# Patient Record
Sex: Male | Born: 1980 | Hispanic: No | Marital: Married | Smoking: Never smoker
Health system: Southern US, Community
[De-identification: ages and names within clinical notes are randomized; demographics above are authoritative.]

## PROBLEM LIST (undated history)

## (undated) DIAGNOSIS — K649 Unspecified hemorrhoids: Secondary | ICD-10-CM

---

## 2015-07-21 ENCOUNTER — Ambulatory Visit (HOSPITAL_COMMUNITY)
Admission: EM | Admit: 2015-07-21 | Discharge: 2015-07-22 | Disposition: A | Discharge status: Home / Self Care | Payer: BLUE CROSS/BLUE SHIELD | Attending: General Surgery | Admitting: General Surgery

## 2015-07-21 ENCOUNTER — Ambulatory Visit (HOSPITAL_COMMUNITY): Payer: BLUE CROSS/BLUE SHIELD | Admitting: Anesthesiology

## 2015-07-21 ENCOUNTER — Encounter (HOSPITAL_COMMUNITY): Admission: EM | Disposition: A | Discharge status: Home / Self Care | Payer: Self-pay | Source: Home / Self Care

## 2015-07-21 ENCOUNTER — Encounter (HOSPITAL_COMMUNITY): Payer: Self-pay

## 2015-07-21 ENCOUNTER — Emergency Department (HOSPITAL_COMMUNITY): Payer: BLUE CROSS/BLUE SHIELD

## 2015-07-21 DIAGNOSIS — K3580 Unspecified acute appendicitis: Secondary | ICD-10-CM | POA: Diagnosis not present

## 2015-07-21 HISTORY — PX: LAPAROSCOPIC APPENDECTOMY: SHX408

## 2015-07-21 LAB — COMPREHENSIVE METABOLIC PANEL
ALBUMIN: 4.2 g/dL (ref 3.5–5.0)
ALK PHOS: 60 U/L (ref 38–126)
ALT: 21 U/L (ref 17–63)
AST: 27 U/L (ref 15–41)
Anion gap: 10 (ref 5–15)
BILIRUBIN TOTAL: 2 mg/dL — AB (ref 0.3–1.2)
BUN: 15 mg/dL (ref 6–20)
CALCIUM: 9.5 mg/dL (ref 8.9–10.3)
CO2: 25 mmol/L (ref 22–32)
CREATININE: 1.07 mg/dL (ref 0.61–1.24)
Chloride: 100 mmol/L — ABNORMAL LOW (ref 101–111)
GFR calc Af Amer: >60 mL/min (ref >60)
GLUCOSE: 149 mg/dL — AB (ref 65–99)
POTASSIUM: 4 mmol/L (ref 3.5–5.1)
Sodium: 135 mmol/L (ref 135–145)
TOTAL PROTEIN: 6.9 g/dL (ref 6.5–8.1)

## 2015-07-21 LAB — CBC WITH DIFFERENTIAL/PLATELET
BASOS ABS: 0 10*3/uL (ref 0.0–0.1)
Basophils Relative: 0 %
EOS ABS: 0 10*3/uL (ref 0.0–0.7)
Eosinophils Relative: 0 %
HEMATOCRIT: 40.5 % (ref 39.0–52.0)
HEMOGLOBIN: 13.5 g/dL (ref 13.0–17.0)
LYMPHS ABS: 0.7 10*3/uL (ref 0.7–4.0)
LYMPHS PCT: 4 %
MCH: 26.3 pg (ref 26.0–34.0)
MCHC: 33.3 g/dL (ref 30.0–36.0)
MCV: 78.9 fL (ref 78.0–100.0)
Monocytes Absolute: 1.8 10*3/uL — ABNORMAL HIGH (ref 0.1–1.0)
Monocytes Relative: 11 %
NEUTROS PCT: 85 %
Neutro Abs: 13.6 10*3/uL — ABNORMAL HIGH (ref 1.7–7.7)
Platelets: 171 10*3/uL (ref 150–400)
RBC: 5.13 MIL/uL (ref 4.22–5.81)
RDW: 12.4 % (ref 11.5–15.5)
WBC: 16 10*3/uL — AB (ref 4.0–10.5)

## 2015-07-21 SURGERY — APPENDECTOMY, LAPAROSCOPIC
Anesthesia: General | Site: Abdomen

## 2015-07-21 MED ORDER — 0.9 % SODIUM CHLORIDE (POUR BTL) OPTIME
TOPICAL | Status: DC | PRN
  Administered 2015-07-21: 1000 mL

## 2015-07-21 MED ORDER — ONDANSETRON HCL 4 MG/2ML IJ SOLN: 4.0000 mg | Freq: Four times a day (QID) | INTRAMUSCULAR | Status: DC | PRN

## 2015-07-21 MED ORDER — PANTOPRAZOLE SODIUM 40 MG IV SOLR
40.0000 mg | Freq: Every day | INTRAVENOUS | Status: DC
  Administered 2015-07-21: 40 mg via INTRAVENOUS
  Filled 2015-07-21: qty 40

## 2015-07-21 MED ORDER — ACETAMINOPHEN 325 MG PO TABS: 650.0000 mg | ORAL_TABLET | Freq: Four times a day (QID) | ORAL | Status: DC | PRN

## 2015-07-21 MED ORDER — BUPIVACAINE-EPINEPHRINE (PF) 0.25% -1:200000 IJ SOLN
INTRAMUSCULAR | Status: AC
  Filled 2015-07-21: qty 30

## 2015-07-21 MED ORDER — METRONIDAZOLE IN NACL 5-0.79 MG/ML-% IV SOLN
500.0000 mg | Freq: Once | INTRAVENOUS | Status: AC
  Administered 2015-07-21: 500 mg via INTRAVENOUS
  Filled 2015-07-21: qty 100

## 2015-07-21 MED ORDER — BUPIVACAINE-EPINEPHRINE 0.25% -1:200000 IJ SOLN
INTRAMUSCULAR | Status: DC | PRN
  Administered 2015-07-21: 10 mL

## 2015-07-21 MED ORDER — IOHEXOL 300 MG/ML  SOLN
100.0000 mL | Freq: Once | INTRAMUSCULAR | Status: AC | PRN
  Administered 2015-07-21: 75 mL via INTRAVENOUS

## 2015-07-21 MED ORDER — LIDOCAINE HCL (CARDIAC) 20 MG/ML IV SOLN
INTRAVENOUS | Status: AC
  Filled 2015-07-21: qty 5

## 2015-07-21 MED ORDER — DEXTROSE 5 % IV SOLN
2.0000 g | Freq: Once | INTRAVENOUS | Status: AC
  Administered 2015-07-21: 2 g via INTRAVENOUS
  Filled 2015-07-21: qty 2

## 2015-07-21 MED ORDER — PROMETHAZINE HCL 25 MG/ML IJ SOLN: 6.2500 mg | INTRAMUSCULAR | Status: DC | PRN

## 2015-07-21 MED ORDER — LACTATED RINGERS IV SOLN
INTRAVENOUS | Status: DC
  Administered 2015-07-21: 17:00:00 via INTRAVENOUS

## 2015-07-21 MED ORDER — FENTANYL CITRATE (PF) 250 MCG/5ML IJ SOLN
INTRAMUSCULAR | Status: AC
  Filled 2015-07-21: qty 5

## 2015-07-21 MED ORDER — SUGAMMADEX SODIUM 500 MG/5ML IV SOLN
INTRAVENOUS | Status: DC | PRN
  Administered 2015-07-21: 118 mg via INTRAVENOUS

## 2015-07-21 MED ORDER — ACETAMINOPHEN 650 MG RE SUPP: 650.0000 mg | Freq: Four times a day (QID) | RECTAL | Status: DC | PRN

## 2015-07-21 MED ORDER — OXYCODONE-ACETAMINOPHEN 5-325 MG PO TABS: 1.0000 | ORAL_TABLET | ORAL | Status: DC | PRN

## 2015-07-21 MED ORDER — MIDAZOLAM HCL 2 MG/2ML IJ SOLN
INTRAMUSCULAR | Status: AC
  Filled 2015-07-21: qty 4

## 2015-07-21 MED ORDER — ONDANSETRON HCL 4 MG/2ML IJ SOLN: 4.0000 mg | INTRAMUSCULAR | Status: DC | PRN

## 2015-07-21 MED ORDER — DEXAMETHASONE SODIUM PHOSPHATE 4 MG/ML IJ SOLN
INTRAMUSCULAR | Status: AC
  Filled 2015-07-21: qty 2

## 2015-07-21 MED ORDER — ONDANSETRON 4 MG PO TBDP: 4.0000 mg | ORAL_TABLET | Freq: Four times a day (QID) | ORAL | Status: DC | PRN

## 2015-07-21 MED ORDER — MORPHINE SULFATE (PF) 2 MG/ML IV SOLN: 1.0000 mg | INTRAVENOUS | Status: DC | PRN

## 2015-07-21 MED ORDER — HYDROMORPHONE HCL 1 MG/ML IJ SOLN: 0.5000 mg | INTRAMUSCULAR | Status: DC | PRN

## 2015-07-21 MED ORDER — KCL IN DEXTROSE-NACL 20-5-0.45 MEQ/L-%-% IV SOLN
INTRAVENOUS | Status: DC
  Administered 2015-07-21: 21:00:00 via INTRAVENOUS
  Filled 2015-07-21: qty 1000

## 2015-07-21 MED ORDER — ROCURONIUM BROMIDE 50 MG/5ML IV SOLN
INTRAVENOUS | Status: AC
  Filled 2015-07-21: qty 1

## 2015-07-21 MED ORDER — MIDAZOLAM HCL 5 MG/5ML IJ SOLN
INTRAMUSCULAR | Status: DC | PRN
  Administered 2015-07-21: 2 mg via INTRAVENOUS

## 2015-07-21 MED ORDER — FENTANYL CITRATE (PF) 100 MCG/2ML IJ SOLN
INTRAMUSCULAR | Status: DC | PRN
  Administered 2015-07-21: 100 ug via INTRAVENOUS

## 2015-07-21 MED ORDER — ONDANSETRON HCL 4 MG/2ML IJ SOLN
INTRAMUSCULAR | Status: DC | PRN
  Administered 2015-07-21: 4 mg via INTRAVENOUS

## 2015-07-21 MED ORDER — PROPOFOL 10 MG/ML IV BOLUS
INTRAVENOUS | Status: AC
  Filled 2015-07-21: qty 20

## 2015-07-21 MED ORDER — ENOXAPARIN SODIUM 40 MG/0.4ML ~~LOC~~ SOLN: 40.0000 mg | SUBCUTANEOUS | Status: DC

## 2015-07-21 MED ORDER — MEPERIDINE HCL 25 MG/ML IJ SOLN: 6.2500 mg | INTRAMUSCULAR | Status: DC | PRN

## 2015-07-21 MED ORDER — ONDANSETRON HCL 4 MG/2ML IJ SOLN
INTRAMUSCULAR | Status: AC
  Filled 2015-07-21: qty 2

## 2015-07-21 MED ORDER — SODIUM CHLORIDE 0.9 % IR SOLN
Status: DC | PRN
  Administered 2015-07-21: 1000 mL

## 2015-07-21 MED ORDER — HYDROMORPHONE HCL 1 MG/ML IJ SOLN: 0.2500 mg | INTRAMUSCULAR | Status: DC | PRN

## 2015-07-21 MED ORDER — LIDOCAINE HCL (CARDIAC) 20 MG/ML IV SOLN
INTRAVENOUS | Status: DC | PRN
  Administered 2015-07-21: 100 mg via INTRAVENOUS

## 2015-07-21 MED ORDER — PROPOFOL 10 MG/ML IV BOLUS
INTRAVENOUS | Status: DC | PRN
  Administered 2015-07-21: 160 mg via INTRAVENOUS

## 2015-07-21 MED ORDER — ZOLPIDEM TARTRATE 5 MG PO TABS: 5.0000 mg | ORAL_TABLET | Freq: Every evening | ORAL | Status: DC | PRN

## 2015-07-21 MED ORDER — SUGAMMADEX SODIUM 500 MG/5ML IV SOLN
INTRAVENOUS | Status: AC
  Filled 2015-07-21: qty 5

## 2015-07-21 MED ORDER — ROCURONIUM BROMIDE 100 MG/10ML IV SOLN
INTRAVENOUS | Status: DC | PRN
  Administered 2015-07-21: 30 mg via INTRAVENOUS
  Administered 2015-07-21: 10 mg via INTRAVENOUS

## 2015-07-21 SURGICAL SUPPLY — 42 items
APPLIER CLIP ROT 10 11.4 M/L (STAPLE)
BLADE SURG ROTATE 9660 (MISCELLANEOUS) ×2 IMPLANT
CANISTER SUCTION 2500CC (MISCELLANEOUS) ×2 IMPLANT
CHLORAPREP W/TINT 26ML (MISCELLANEOUS) ×2 IMPLANT
CLIP APPLIE ROT 10 11.4 M/L (STAPLE) IMPLANT
CLSR STERI-STRIP ANTIMIC 1/2X4 (GAUZE/BANDAGES/DRESSINGS) ×2 IMPLANT
COVER SURGICAL LIGHT HANDLE (MISCELLANEOUS) ×2 IMPLANT
CUTTER LINEAR ENDO 35 ART FLEX (STAPLE) ×2 IMPLANT
CUTTER LINEAR ENDO 35 ETS (STAPLE) IMPLANT
DERMABOND ADVANCED (GAUZE/BANDAGES/DRESSINGS) ×1
DERMABOND ADVANCED .7 DNX12 (GAUZE/BANDAGES/DRESSINGS) ×1 IMPLANT
DRSG TEGADERM 2-3/8X2-3/4 SM (GAUZE/BANDAGES/DRESSINGS) ×2 IMPLANT
ELECT REM PT RETURN 9FT ADLT (ELECTROSURGICAL) ×2
ELECTRODE REM PT RTRN 9FT ADLT (ELECTROSURGICAL) ×1 IMPLANT
ENDOLOOP SUT PDS II  0 18 (SUTURE)
ENDOLOOP SUT PDS II 0 18 (SUTURE) IMPLANT
GLOVE BIOGEL PI IND STRL 8 (GLOVE) ×1 IMPLANT
GLOVE BIOGEL PI INDICATOR 8 (GLOVE) ×1
GLOVE ECLIPSE 7.5 STRL STRAW (GLOVE) ×2 IMPLANT
GOWN STRL REUS W/ TWL LRG LVL3 (GOWN DISPOSABLE) ×3 IMPLANT
GOWN STRL REUS W/TWL LRG LVL3 (GOWN DISPOSABLE) ×3
KIT BASIN OR (CUSTOM PROCEDURE TRAY) ×2 IMPLANT
KIT ROOM TURNOVER OR (KITS) ×2 IMPLANT
NS IRRIG 1000ML POUR BTL (IV SOLUTION) ×2 IMPLANT
PAD ARMBOARD 7.5X6 YLW CONV (MISCELLANEOUS) ×4 IMPLANT
PENCIL BUTTON HOLSTER BLD 10FT (ELECTRODE) ×2 IMPLANT
POUCH SPECIMEN RETRIEVAL 10MM (ENDOMECHANICALS) ×2 IMPLANT
RELOAD /EVU35 (ENDOMECHANICALS) IMPLANT
RELOAD CUTTER ETS 35MM STAND (ENDOMECHANICALS) IMPLANT
SCALPEL HARMONIC ACE (MISCELLANEOUS) ×2 IMPLANT
SET IRRIG TUBING LAPAROSCOPIC (IRRIGATION / IRRIGATOR) ×2 IMPLANT
SLEEVE ENDOPATH XCEL 5M (ENDOMECHANICALS) ×2 IMPLANT
SPECIMEN JAR SMALL (MISCELLANEOUS) ×2 IMPLANT
STRIP CLOSURE SKIN 1/2X4 (GAUZE/BANDAGES/DRESSINGS) ×2 IMPLANT
SUT MNCRL AB 4-0 PS2 18 (SUTURE) ×2 IMPLANT
TOWEL OR 17X24 6PK STRL BLUE (TOWEL DISPOSABLE) ×2 IMPLANT
TOWEL OR 17X26 10 PK STRL BLUE (TOWEL DISPOSABLE) ×2 IMPLANT
TRAY FOLEY CATH 16FR SILVER (SET/KITS/TRAYS/PACK) ×2 IMPLANT
TRAY LAPAROSCOPIC MC (CUSTOM PROCEDURE TRAY) ×2 IMPLANT
TROCAR XCEL BLUNT TIP 100MML (ENDOMECHANICALS) ×2 IMPLANT
TROCAR XCEL NON-BLD 5MMX100MML (ENDOMECHANICALS) ×2 IMPLANT
TUBING INSUFFLATION (TUBING) ×2 IMPLANT

## 2015-07-21 NOTE — ED Notes (Addendum)
Pt. Reports having abdominal discomfort since yesterday.  He went to [facility] and and they drew blood work and sent him to [place] for a CT of the abdomen to rule out appendicitis.  Pain  Has decreased, He was given an injection for pain relief. He is not sure what it was.  Pt. Denies any nausea or vomiting.

## 2015-07-21 NOTE — Anesthesia Preprocedure Evaluation (Signed)
Anesthesia Evaluation  Patient identified by MRN, date of birth, ID band Patient awake    Reviewed: Allergy & Precautions, NPO status , Patient's Chart, lab work & pertinent test results  Airway Mallampati: II  TM Distance: >3 FB Neck ROM: Full    Dental no notable dental hx.    Pulmonary neg pulmonary ROS,    Pulmonary exam normal breath sounds clear to auscultation       Cardiovascular negative cardio ROS Normal cardiovascular exam Rhythm:Regular Rate:Normal     Neuro/Psych negative neurological ROS  negative psych ROS   GI/Hepatic negative GI ROS, Neg liver ROS,   Endo/Other  negative endocrine ROS  Renal/GU negative Renal ROS     Musculoskeletal negative musculoskeletal ROS (+)   Abdominal   Peds  Hematology negative hematology ROS (+)   Anesthesia Other Findings   Reproductive/Obstetrics                             Anesthesia Physical Anesthesia Plan  ASA: II and emergent  Anesthesia Plan: General   Post-op Pain Management:    Induction: Intravenous, Rapid sequence and Cricoid pressure planned  Airway Management Planned: Oral ETT  Additional Equipment:   Intra-op Plan:   Post-operative Plan: Extubation in OR  Informed Consent: I have reviewed the patients History and Physical, chart, labs and discussed the procedure including the risks, benefits and alternatives for the proposed anesthesia with the patient or authorized representative who has indicated his/her understanding and acceptance.   Dental advisory given  Plan Discussed with: CRNA  Anesthesia Plan Comments:         Anesthesia Quick Evaluation

## 2015-07-21 NOTE — H&P (Signed)
Matthew Schmidt Matthew Schmidt [birth year]  [id number].   Chief Complaint/Reason for Consult: acute appendicitis HPI: This is a 34 yo otherwise healthy male who before having generalized abdominal pain around 1800pm yesterday.  He went to play tennis last night and then had worsening pain.  He denied any nausea, vomiting, or diarrhea.  He denies any fevers, but has had some chills.  He was unable to sleep last night as his pain continued to worsen.  He came in to the [facility] where he had a CT scan that revealed acute appendicitis.  We have been asked to see for admission.  ROS : Please see HPI otherwise negative  History reviewed. No pertinent family history.  History reviewed. No pertinent past medical history.  History reviewed. No pertinent past surgical history.  Social History:  reports that he has never smoked. He does not have any smokeless tobacco history on file. He reports that he drinks alcohol. He reports that he does not use illicit drugs.  Allergies: No Known Allergies   (Not in a hospital admission)  Blood pressure 126/84, pulse 77, temperature 98.7 F (37.1 C), temperature source Oral, resp. rate 16, SpO2 100 %. Physical Exam: General: pleasant, WD, WN [place] male who is laying in bed in NAD HEENT: head is normocephalic, atraumatic.  Sclera are noninjected.  PERRL.  Ears and nose without any masses or lesions.  Mouth is pink and moist Heart: regular, rate, and rhythm.  Normal s1,s2. No obvious murmurs, gallops, or rubs noted.  Palpable radial and pedal pulses bilaterally Lungs: CTAB, no wheezes, rhonchi, or rales noted.  Respiratory effort nonlabored Abd: soft, tender in RLQ, ND, +BS, no masses, hernias, or organomegaly MS: all 4 extremities are symmetrical with no cyanosis, clubbing, or edema. Skin: warm and dry with no masses, lesions, or rashes Psych: A&Ox3 with an appropriate affect.    Results for orders placed or performed during the hospital encounter of 07/21/15 (from the  past 48 hour(s))  CBC with Differential     Status: Abnormal   Collection Time: 07/21/15  1:46 PM  Result Value Ref Range   WBC 16.0 (H) 4.0 - 10.5 K/uL   RBC 5.13 4.22 - 5.81 MIL/uL   Hemoglobin 13.5 13.0 - 17.0 g/dL   HCT 40.5 39.0 - 52.0 %   MCV 78.9 78.0 - 100.0 fL   MCH 26.3 26.0 - 34.0 pg   MCHC 33.3 30.0 - 36.0 g/dL   RDW 12.4 11.5 - 15.5 %   Platelets 171 150 - 400 K/uL   Neutrophils Relative % 85 %   Neutro Abs 13.6 (H) 1.7 - 7.7 K/uL   Lymphocytes Relative 4 %   Lymphs Abs 0.7 0.7 - 4.0 K/uL   Monocytes Relative 11 %   Monocytes Absolute 1.8 (H) 0.1 - 1.0 K/uL   Eosinophils Relative 0 %   Eosinophils Absolute 0.0 0.0 - 0.7 K/uL   Basophils Relative 0 %   Basophils Absolute 0.0 0.0 - 0.1 K/uL  Comprehensive metabolic panel     Status: Abnormal   Collection Time: 07/21/15  1:46 PM  Result Value Ref Range   Sodium 135 135 - 145 mmol/L   Potassium 4.0 3.5 - 5.1 mmol/L   Chloride 100 (L) 101 - 111 mmol/L   CO2 25 22 - 32 mmol/L   Glucose, Bld 149 (H) 65 - 99 mg/dL   BUN 15 6 - 20 mg/dL   Creatinine, Ser 1.07 0.61 - 1.24 mg/dL   Calcium 9.5 8.9 - 10.3  mg/dL   Total Protein 6.9 6.5 - 8.1 g/dL   Albumin 4.2 3.5 - 5.0 g/dL   AST 27 15 - 41 U/L   ALT 21 17 - 63 U/L   Alkaline Phosphatase 60 38 - 126 U/L   Total Bilirubin 2.0 (H) 0.3 - 1.2 mg/dL   GFR calc non Af Amer >60 >60 mL/min   GFR calc Af Amer >60 >60 mL/min    Comment: (NOTE) The eGFR has been calculated using the CKD EPI equation. This calculation has not been validated in all clinical situations. eGFR's persistently <60 mL/min signify possible Chronic Kidney Disease.    Anion gap 10 5 - 15   Ct Abdomen Pelvis W Contrast  07/21/2015  CLINICAL DATA:  One day history of lower abdominal pain EXAM: CT ABDOMEN AND PELVIS WITH CONTRAST TECHNIQUE: Multidetector CT imaging of the abdomen and pelvis was performed using the standard protocol following bolus administration of intravenous contrast. CONTRAST:  39mL  OMNIPAQUE IOHEXOL 300 MG/ML  SOLN COMPARISON:  None. FINDINGS: Lower chest:  Lung bases are clear. Hepatobiliary: No focal liver lesions are identified. Gallbladder wall is not appreciably thickened. There is no biliary duct dilatation. Pancreas: No pancreatic mass or inflammatory focus. Spleen: No splenic lesions are identified. Adrenals/Urinary Tract: Adrenals appear normal bilaterally. There is a 7 x 6 mm cyst in the anterior mid right kidney. There is an 8 x 8 mm cyst in the lateral lower pole left kidney. There is no hydronephrosis on either side. There is no renal or ureteral calculus on either side. Urinary bladder is midline with normal wall thickness. Stomach/Bowel: No bowel wall or mesenteric thickening. No bowel obstruction. No free air or portal venous air. Vascular/Lymphatic: There is no abdominal aortic aneurysm. Major arterial mesenteric vessels appear normal. No vascular lesions are identified. There is no adenopathy in the abdomen or pelvis. Reproductive: Prostate is normal in size and contour. There is no pelvic mass or pelvic fluid. Other: There are two appendicoliths within the appendix. Appendix is distended to just over 12 mm. There is equivocal mesenteric thickening surrounding the proximal appendix. No abscess or evidence suggesting perforation. Elsewhere there is no abscess or ascites in the abdomen or pelvis. Musculoskeletal: There are no blastic or lytic bone lesions. No intramuscular abdominal wall lesions. IMPRESSION: There are appendicoliths within the appendix. Appendix is dilated with equivocal mesenteric thickening adjacent to the proximal appendix. Suspect early acute appendicitis. No abscess or perforation. No bowel obstruction or bowel wall thickening. Elsewhere no abscess or ascites. No renal or ureteral calculus. No hydronephrosis. Critical Value/emergent results were called by telephone at the time of interpretation on 07/21/2015 at 3:31 pm to [facility] , who verbally  acknowledged these results. Electronically Signed   By: Lowella Grip III M.D.   On: 07/21/2015 15:32       Assessment/Plan 1. Acute appendicitis -admit, IVFs, pain and nausea meds -to OR tonight for lap appy -Rocephin/Flagyl -discussed procedures, risks, complications, expected outcome, etc with patient and wife.  All questions were answered.  Patient agreeable to proceed.  Fadel Clason E 07/21/2015, 4:23 PM Pager: [phone]

## 2015-07-21 NOTE — Anesthesia Postprocedure Evaluation (Signed)
Anesthesia Post Note  Patient: Matthew Schmidt  Procedure(s) Performed: Procedure(s) (LRB): APPENDECTOMY LAPAROSCOPIC (N/A)  Anesthesia type: General  Patient location: PACU  Post pain: Pain level controlled  Post assessment: Post-op Vital signs reviewed  Last Vitals: BP 104/62 mmHg  Pulse 69  Temp(Src) 36.8 C (Oral)  Resp 21  Ht 5\' 5"  (1.651 m)  Wt 135 lb 9.3 oz (61.5 kg)  BMI 22.56 kg/m2  SpO2 92%  Post vital signs: Reviewed  Level of consciousness: sedated  Complications: No apparent anesthesia complications

## 2015-07-21 NOTE — Anesthesia Procedure Notes (Signed)
Procedure Name: Intubation Date/Time: 07/21/2015 6:42 PM Performed by: Laretta Alstrom Pre-anesthesia Checklist: Patient identified, Emergency Drugs available, Suction available, Patient being monitored and Timeout performed Patient Re-evaluated:Patient Re-evaluated prior to inductionOxygen Delivery Method: Circle system utilized Preoxygenation: Pre-oxygenation with 100% oxygen Intubation Type: IV induction Ventilation: Mask ventilation without difficulty Laryngoscope Size: Mac and 3 Grade View: Grade I Tube type: Oral Tube size: 7.0 mm Number of attempts: 1 Airway Equipment and Method: Stylet Placement Confirmation: ETT inserted through vocal cords under direct vision,  positive ETCO2 and breath sounds checked- equal and bilateral Secured at: 21 cm Tube secured with: Tape Dental Injury: Teeth and Oropharynx as per pre-operative assessment

## 2015-07-21 NOTE — Transfer of Care (Signed)
Immediate Anesthesia Transfer of Care Note  Patient: Matthew Schmidt  Procedure(s) Performed: Procedure(s): APPENDECTOMY LAPAROSCOPIC (N/A)  Patient Location: PACU  Anesthesia Type:General  Level of Consciousness: lethargic and responds to stimulation  Airway & Oxygen Therapy: Patient Spontanous Breathing and Patient connected to nasal cannula oxygen  Post-op Assessment: Report given to RN  Post vital signs: Reviewed and stable  Last Vitals:  Filed Vitals:   07/21/15 1645  BP: 115/76  Pulse: 64  Temp:   Resp:     Complications: No apparent anesthesia complications

## 2015-07-21 NOTE — ED Notes (Signed)
PA at bedside.

## 2015-07-21 NOTE — ED Provider Notes (Signed)
CSN: [id number]     Arrival date & time 07/21/15  1157 History   First MD Initiated Contact with Patient 07/21/15 1337     Chief Complaint  Patient presents with  . Abdominal Pain     (Consider location/radiation/quality/duration/timing/severity/associated sxs/prior Treatment) Patient is a 34 y.o. male presenting with abdominal pain. The history is provided by the patient and medical records.  Abdominal Pain Associated symptoms: nausea      34 year old male with no significant past medical history presenting to the ED for abdominal pain. Patient states pain began yesterday afternoon, seemed to be generalized throughout his entire abdomen. He states pain persisted throughout the night and worsened this morning. He made himself vomit thinking it was something he ate, however no improvement.  Bowel movements have been normal, no diarrhea.  No fever, chills, sweats.  No urinary symptoms or flank pain.  He went to [facility] physicians for evaluation, had some blood work done which revealed and elevated WBC count and he was sent her with concern for appendicitis. Patient has no history of prior abdominal surgeries. He was given an injection of pain medication prior to arriving in the ED, reports pain has mostly subsided at this time. Vital signs stable.  History reviewed. No pertinent past medical history. History reviewed. No pertinent past surgical history. No family history on file. Social History  Substance Use Topics  . Smoking status: Never Smoker   . Smokeless tobacco: None  . Alcohol Use: No    Review of Systems  Gastrointestinal: Positive for nausea and abdominal pain.  All other systems reviewed and are negative.     Allergies  Review of patient's allergies indicates not on file.  Home Medications   Prior to Admission medications   Not on File   BP 126/80 mmHg  Pulse 70  Temp(Src) 98.7 F (37.1 C) (Oral)  Resp 18  SpO2 99%   Physical Exam  Constitutional: He is  oriented to person, place, and time. He appears well-developed and well-nourished. No distress.  HENT:  Head: Normocephalic and atraumatic.  Mouth/Throat: Oropharynx is clear and moist.  Eyes: Conjunctivae and EOM are normal. Pupils are equal, round, and reactive to light.  Neck: Normal range of motion. Neck supple.  Cardiovascular: Normal rate, regular rhythm and normal heart sounds.   Pulmonary/Chest: Effort normal and breath sounds normal. No respiratory distress. He has no wheezes.  Abdominal: Soft. Bowel sounds are normal. There is tenderness in the right lower quadrant and periumbilical area. There is no guarding and no CVA tenderness.  Musculoskeletal: Normal range of motion. He exhibits no edema.  Neurological: He is alert and oriented to person, place, and time.  Skin: Skin is warm. He is not diaphoretic.  Psychiatric: He has a normal mood and affect.  Nursing note and vitals reviewed.   ED Course  Procedures (including critical care time) Labs Review Labs Reviewed  CBC WITH DIFFERENTIAL/PLATELET - Abnormal; Notable for the following:    WBC 16.0 (*)    Neutro Abs 13.6 (*)    Monocytes Absolute 1.8 (*)    All other components within normal limits  COMPREHENSIVE METABOLIC PANEL - Abnormal; Notable for the following:    Chloride 100 (*)    Glucose, Bld 149 (*)    Total Bilirubin 2.0 (*)    All other components within normal limits    Imaging Review Ct Abdomen Pelvis W Contrast  07/21/2015  CLINICAL DATA:  One day history of lower abdominal pain EXAM: CT ABDOMEN  AND PELVIS WITH CONTRAST TECHNIQUE: Multidetector CT imaging of the abdomen and pelvis was performed using the standard protocol following bolus administration of intravenous contrast. CONTRAST:  40mL OMNIPAQUE IOHEXOL 300 MG/ML  SOLN COMPARISON:  None. FINDINGS: Lower chest:  Lung bases are clear. Hepatobiliary: No focal liver lesions are identified. Gallbladder wall is not appreciably thickened. There is no biliary  duct dilatation. Pancreas: No pancreatic mass or inflammatory focus. Spleen: No splenic lesions are identified. Adrenals/Urinary Tract: Adrenals appear normal bilaterally. There is a 7 x 6 mm cyst in the anterior mid right kidney. There is an 8 x 8 mm cyst in the lateral lower pole left kidney. There is no hydronephrosis on either side. There is no renal or ureteral calculus on either side. Urinary bladder is midline with normal wall thickness. Stomach/Bowel: No bowel wall or mesenteric thickening. No bowel obstruction. No free air or portal venous air. Vascular/Lymphatic: There is no abdominal aortic aneurysm. Major arterial mesenteric vessels appear normal. No vascular lesions are identified. There is no adenopathy in the abdomen or pelvis. Reproductive: Prostate is normal in size and contour. There is no pelvic mass or pelvic fluid. Other: There are two appendicoliths within the appendix. Appendix is distended to just over 12 mm. There is equivocal mesenteric thickening surrounding the proximal appendix. No abscess or evidence suggesting perforation. Elsewhere there is no abscess or ascites in the abdomen or pelvis. Musculoskeletal: There are no blastic or lytic bone lesions. No intramuscular abdominal wall lesions. IMPRESSION: There are appendicoliths within the appendix. Appendix is dilated with equivocal mesenteric thickening adjacent to the proximal appendix. Suspect early acute appendicitis. No abscess or perforation. No bowel obstruction or bowel wall thickening. Elsewhere no abscess or ascites. No renal or ureteral calculus. No hydronephrosis. Critical Value/emergent results were called by telephone at the time of interpretation on 07/21/2015 at 3:31 pm to [facility], PA , who verbally acknowledged these results. Electronically Signed   By: Lowella Grip III M.D.   On: 07/21/2015 15:32   I have personally reviewed and evaluated these images and lab results as part of my medical decision-making.    EKG Interpretation None      MDM   Final diagnoses:  Acute appendicitis, unspecified acute appendicitis type   34 year old male here with abdominal pain. Seen at [place] earlier today and sent here for CT to rule out appendicitis. Patient was given IM injection prior to arrival, reports no pain currently. He has tenderness in his periumbilical region and right lower quadrant. Lab work with noted leukocytosis at 16K.  CT scan was obtained with findings consistent with early appendicitis.  Patient received pre-op rocephin and flagyl.  Case discussed with general surgery, will evaluate in the ED and admit for further management.  Larene Pickett, PA-C 07/21/15 [place], MD 07/22/15 [phone]

## 2015-07-21 NOTE — ED Notes (Signed)
Patient transported to CT 

## 2015-07-21 NOTE — ED Notes (Signed)
All pt belongings with wife in belongings bag.

## 2015-07-21 NOTE — Progress Notes (Signed)
Report given to Jodette, RN in procedural short stay. Pt's condition remains stable, call bell in reach. Waunita Schooner, CRNA also aware that this nurse leaving for the night.

## 2015-07-21 NOTE — Op Note (Signed)
OPERATIVE REPORT  DATE OF OPERATION: 07/21/2015  PATIENT:  Matthew Schmidt  34 y.o. male  PRE-OPERATIVE DIAGNOSIS:  Appendicitis  POST-OPERATIVE DIAGNOSIS:  Appendicitis  PROCEDURE:  Procedure(s): APPENDECTOMY LAPAROSCOPIC  SURGEON:  Surgeon(s): Judeth Horn, MD  ASSISTANT: None  ANESTHESIA:   general  EBL: <10 ml  BLOOD ADMINISTERED: none  DRAINS: none   SPECIMEN:  Source of Specimen:  Appendix  COUNTS CORRECT:  YES  PROCEDURE DETAILS: The patient was taken to the operating room and placed on table in supine position. After an adequate general endotracheal anesthetic was administered, he was prepped and draped in usual sterile manner exposing his abdomen.  A proper timeout was performed identifying the patient and the procedure to be performed. A supraumbilical midline incision was made using a #15 blade and taken down to the midline fascia. The fascia was incised and we bluntly dissected down into the perineal cavity safely. A pursestring suture of 0 Vicryl was passed around the fascial opening which secured in a Hassan cannula which was used to insufflate carbon dioxide gas up to a maximal intra-abdominal pressure of 15 mmHg.  A right upper quadrant 5 mm cannula and a left low quadrant 5 mm cannula pass under direct vision. The patient was placed in Trendelenburg position and the left side was tilted down.  The appendix had a small amount of exudate on the surface and was dilated and obviously inflamed. We dissected out the base of the mesial appendix at the cecum using a Harmonic Scalpel. Once the mesial appendix was completely taken we came across the base of the appendix using a blue cartridge Endo GIA. This completely detached the appendix from the cecum which we retrieved from the peritoneal cavity using an Endo Catch bag through the umbilical port site.  We subsequently inspected the base of the cecum for bleeding and cauterized the edges lightly. We aspirated all  fluid and gas in tied off the fascial opening using the pursestring suture which was in place.  We injected 0.25% Marcaine with epinephrine at all sites. The skin at the super umbilical skin site was closed using a running subcuticular stitch of 4-0 Monocryl. Dermabond Steri-Strips and Tegaderm used to complete the dressings. All needle counts, sponge counts, and instrument counts were correct.  PATIENT DISPOSITION:  PACU - hemodynamically stable.   Kris Burd 11/9/20167:48 PM

## 2015-07-21 NOTE — ED Notes (Signed)
General Surgery PA at bedside 

## 2015-07-22 ENCOUNTER — Encounter (HOSPITAL_COMMUNITY): Payer: Self-pay

## 2015-07-22 MED ORDER — OXYCODONE-ACETAMINOPHEN 5-325 MG PO TABS: 1.0000 | ORAL_TABLET | ORAL | Status: DC | PRN

## 2015-07-22 NOTE — Discharge Instructions (Signed)

## 2015-07-22 NOTE — Progress Notes (Signed)
Discussed discharge summary with patient. Reviewed all medications with patient. Patient received work note and Rx. Patient ready for discharge.

## 2015-07-22 NOTE — Discharge Summary (Signed)
Physician Discharge Summary  Patient ID: Matthew Schmidt MRN: [id number] DOB/AGE: [birth year] 34 y.o.  Admit date: 07/21/2015 Discharge date: 07/22/2015  Admitting Diagnosis: Acute appendicitis  Discharge Diagnosis Patient Active Problem List   Diagnosis Date Noted  . Acute appendicitis 07/21/2015    Consultants none  Imaging: Ct Abdomen Pelvis W Contrast  07/21/2015  CLINICAL DATA:  One day history of lower abdominal pain EXAM: CT ABDOMEN AND PELVIS WITH CONTRAST TECHNIQUE: Multidetector CT imaging of the abdomen and pelvis was performed using the standard protocol following bolus administration of intravenous contrast. CONTRAST:  53mL OMNIPAQUE IOHEXOL 300 MG/ML  SOLN COMPARISON:  None. FINDINGS: Lower chest:  Lung bases are clear. Hepatobiliary: No focal liver lesions are identified. Gallbladder wall is not appreciably thickened. There is no biliary duct dilatation. Pancreas: No pancreatic mass or inflammatory focus. Spleen: No splenic lesions are identified. Adrenals/Urinary Tract: Adrenals appear normal bilaterally. There is a 7 x 6 mm cyst in the anterior mid right kidney. There is an 8 x 8 mm cyst in the lateral lower pole left kidney. There is no hydronephrosis on either side. There is no renal or ureteral calculus on either side. Urinary bladder is midline with normal wall thickness. Stomach/Bowel: No bowel wall or mesenteric thickening. No bowel obstruction. No free air or portal venous air. Vascular/Lymphatic: There is no abdominal aortic aneurysm. Major arterial mesenteric vessels appear normal. No vascular lesions are identified. There is no adenopathy in the abdomen or pelvis. Reproductive: Prostate is normal in size and contour. There is no pelvic mass or pelvic fluid. Other: There are two appendicoliths within the appendix. Appendix is distended to just over 12 mm. There is equivocal mesenteric thickening surrounding the proximal appendix. No abscess or evidence suggesting  perforation. Elsewhere there is no abscess or ascites in the abdomen or pelvis. Musculoskeletal: There are no blastic or lytic bone lesions. No intramuscular abdominal wall lesions. IMPRESSION: There are appendicoliths within the appendix. Appendix is dilated with equivocal mesenteric thickening adjacent to the proximal appendix. Suspect early acute appendicitis. No abscess or perforation. No bowel obstruction or bowel wall thickening. Elsewhere no abscess or ascites. No renal or ureteral calculus. No hydronephrosis. Critical Value/emergent results were called by telephone at the time of interpretation on 07/21/2015 at 3:31 pm to [facility] , who verbally acknowledged these results. Electronically Signed   By: Lowella Grip III M.D.   On: 07/21/2015 15:32    Procedures Laparoscopic appendectomy---Dr. Herbert Pun Course:  Matthew Schmidt is a healthy male who presented to [facility] with abdominal pain.  Workup showed acute appendicitis.  Patient was admitted and underwent procedure listed above.  Tolerated procedure well and was transferred to the floor.  Diet was advanced as tolerated.  On POD#1, the patient was voiding well, tolerating diet, ambulating well, pain well controlled, vital signs stable, incisions c/d/i and felt stable for discharge home. Medication risks, benefits and therapeutic alternatives were reviewed with the patient.   He verbalizes understanding.  Patient will follow up in our office in 2 weeks and knows to call with questions or concerns.  Physical Exam: General:  Alert, NAD, pleasant, comfortable Abd:  Soft, ND, mild tenderness, incisions C/D/I    Medication List    TAKE these medications        aspirin-acetaminophen-caffeine 250-250-65 MG tablet  Commonly known as:  EXCEDRIN MIGRAINE  Take 1 tablet by mouth every 6 (six) hours as needed for headache.     folic acid A999333 MCG tablet  Commonly  known as:  FOLVITE  Take 400 mcg by mouth daily.      oxyCODONE-acetaminophen 5-325 MG tablet  Commonly known as:  PERCOCET/ROXICET  Take 1-2 tablets by mouth every 4 (four) hours as needed for moderate pain.             Follow-up Information    Follow up with [facility] On 08/11/2015.   Specialty:  General Surgery   Why:  arrive by 8:15AM for a 8:45AM post operative check up with [facility] the physician assistant   Contact information:   [place]  [phone]       Signed: Erby Pian, [facility] [phone]  07/22/2015, 9:47 AM

## 2017-05-17 IMAGING — CT CT ABD-PELV W/ CM
2 of 4 series · 15 of 46 positions shown, 17 images · IV contrast (Omni 300)
Comparison: None.

CLINICAL DATA: One day history of lower abdominal pain

EXAM:
CT ABDOMEN AND PELVIS WITH CONTRAST
TECHNIQUE: Multidetector CT imaging of the abdomen and pelvis was performed
using the standard protocol following bolus administration of
intravenous contrast.
CONTRAST:  75mL OMNIPAQUE IOHEXOL 300 MG/ML  SOLN

[Series 2: a/p w/ 5mm · axial · 0.68mm/px · z∈[-727,-292]mm · 12 of 97 slices shown, 14 images]
[im 5/97  soft-tissue]
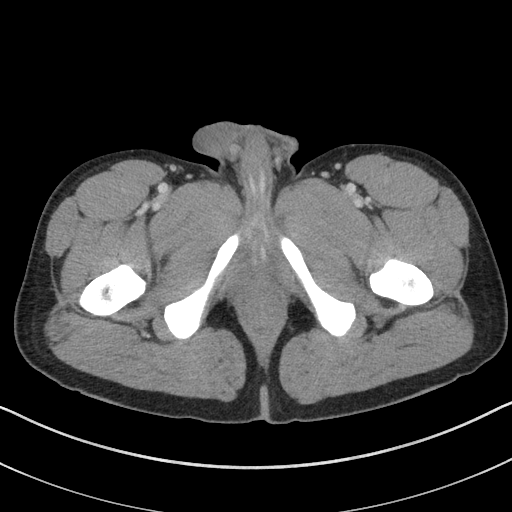
[im 5/97  bone]
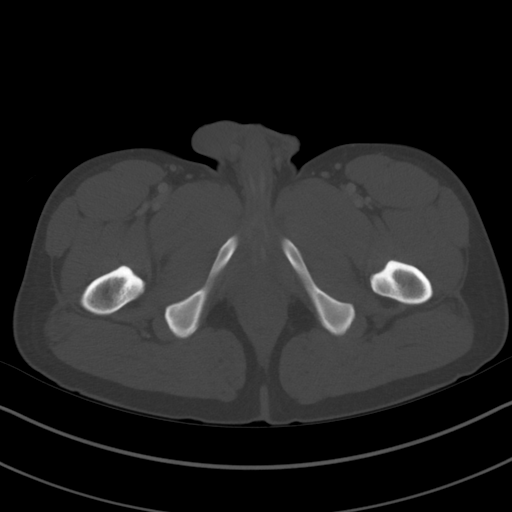
[im 14/97  soft-tissue]
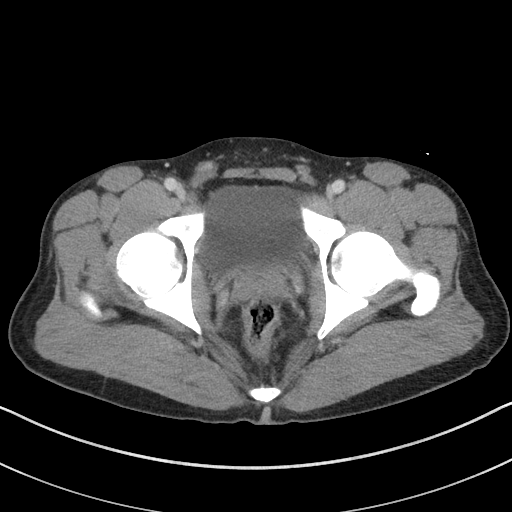
[im 22/97  soft-tissue]
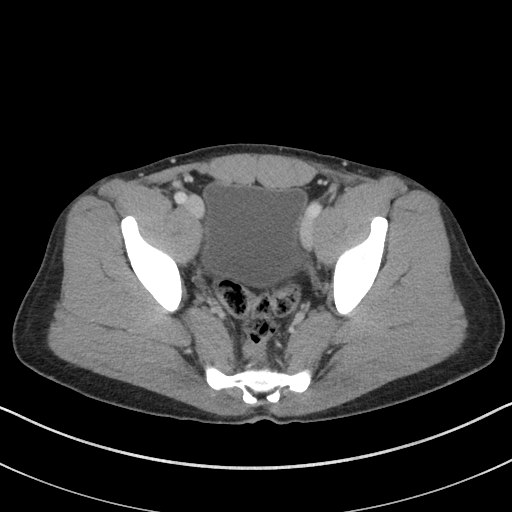
[im 31/97  soft-tissue]
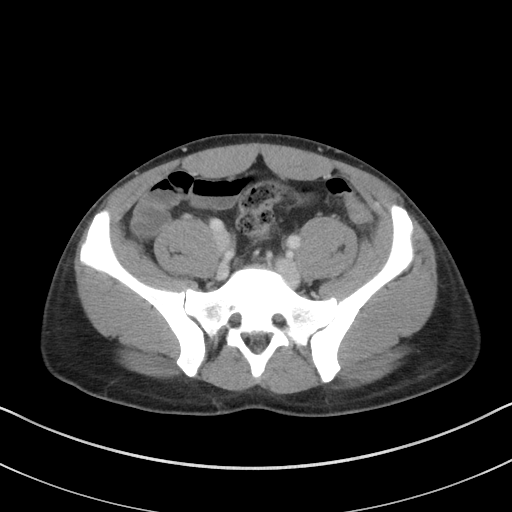
[im 35/97  soft-tissue]
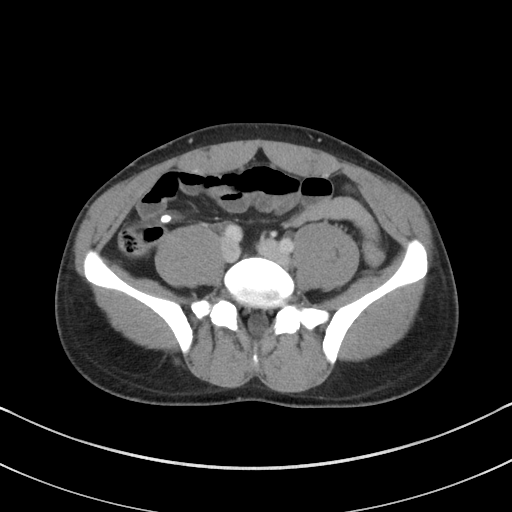
[im 44/97  soft-tissue]
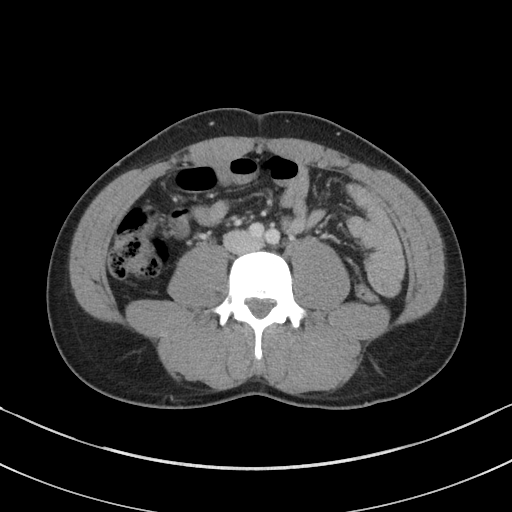
[im 53/97  soft-tissue]
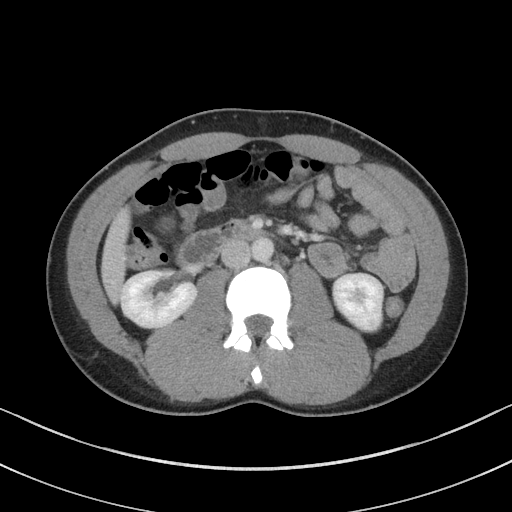
[im 62/97  soft-tissue]
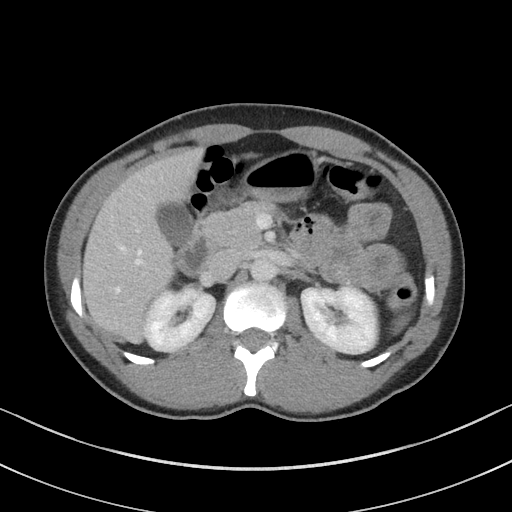
[im 66/97  soft-tissue]
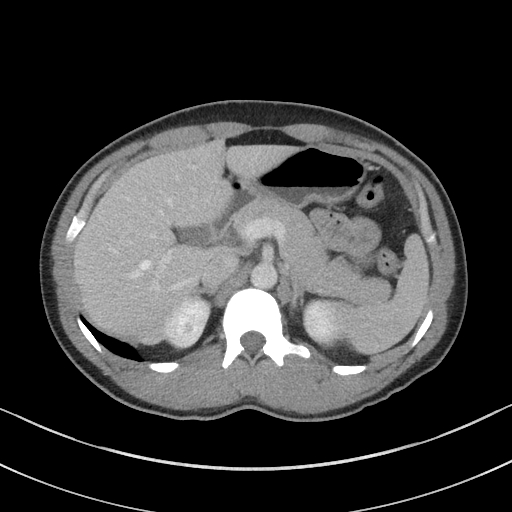
[im 66/97  bone]
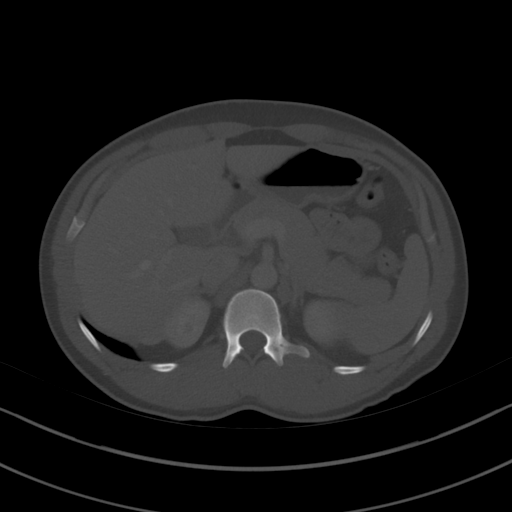
[im 75/97  soft-tissue]
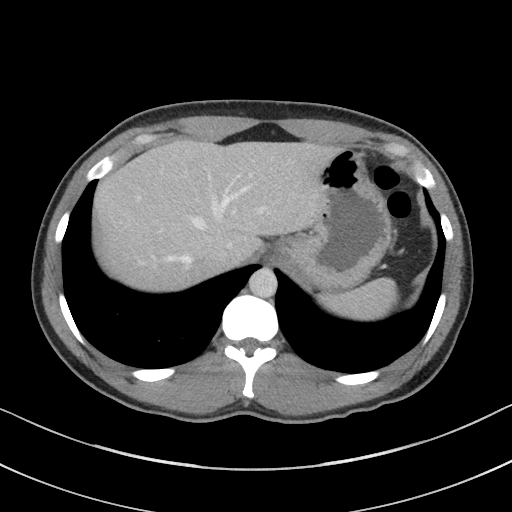
[im 83/97  soft-tissue]
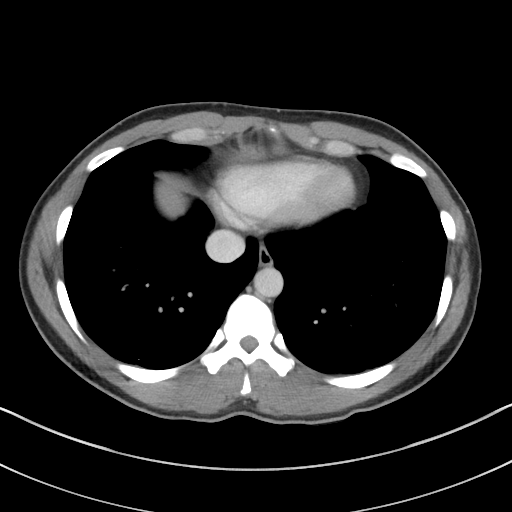
[im 92/97  soft-tissue]
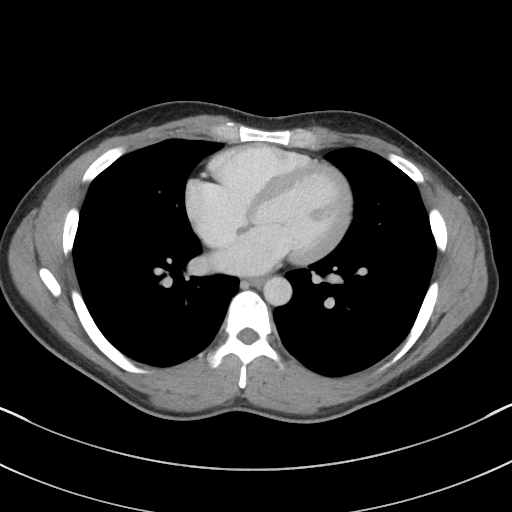

[Series 5: a/p w/ cor · coronal · 0.66mm/px · 3 of 106 slices shown]
[im 36/106  soft-tissue]
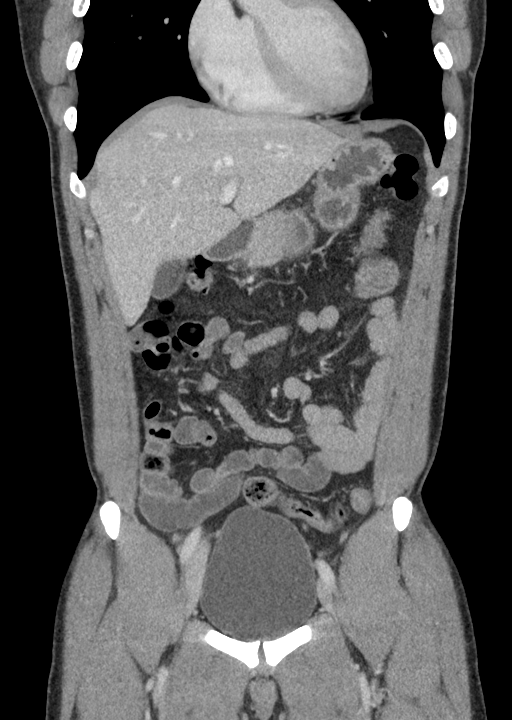
[im 47/106  soft-tissue]
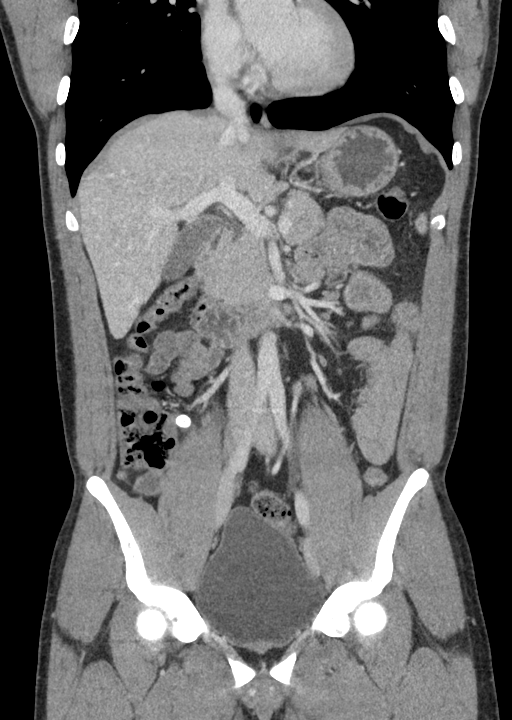
[im 59/106  soft-tissue]
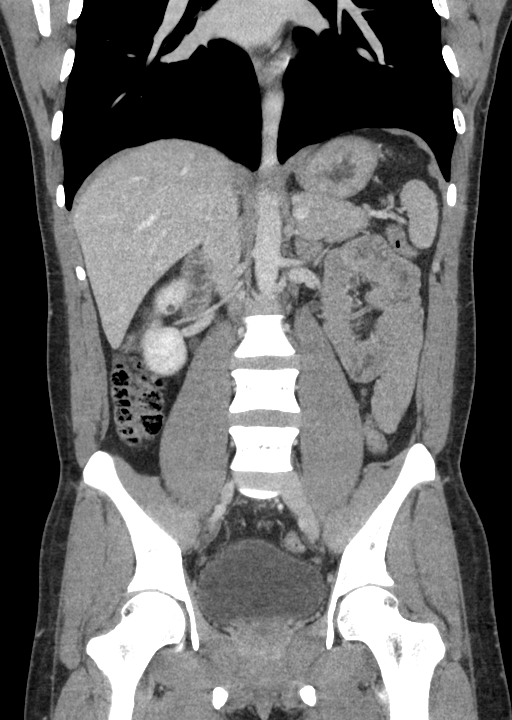

[15 of 46 positions shown; findings below may reference images not displayed]

FINDINGS: Lower chest:  Lung bases are clear.

Hepatobiliary: No focal liver lesions are identified. Gallbladder
wall is not appreciably thickened. There is no biliary duct
dilatation.

Pancreas: No pancreatic mass or inflammatory focus.

Spleen: No splenic lesions are identified.

Adrenals/Urinary Tract: Adrenals appear normal bilaterally. There is
a 7 x 6 mm cyst in the anterior mid right kidney. There is an 8 x 8
mm cyst in the lateral lower pole left kidney. There is no
hydronephrosis on either side. There is no renal or ureteral
calculus on either side. Urinary bladder is midline with normal wall
thickness.

Stomach/Bowel: No bowel wall or mesenteric thickening. No bowel
obstruction. No free air or portal venous air.

Vascular/Lymphatic: There is no abdominal aortic aneurysm. Major
arterial mesenteric vessels appear normal. No vascular lesions are
identified. There is no adenopathy in the abdomen or pelvis.

Reproductive: Prostate is normal in size and contour. There is no
pelvic mass or pelvic fluid.

Other: There are two appendicoliths within the appendix. Appendix is
distended to just over 12 mm. There is equivocal mesenteric
thickening surrounding the proximal appendix. No abscess or evidence
suggesting perforation. Elsewhere there is no abscess or ascites in
the abdomen or pelvis.

Musculoskeletal: There are no blastic or lytic bone lesions. No
intramuscular abdominal wall lesions.
IMPRESSION: There are appendicoliths within the appendix. Appendix is dilated
with equivocal mesenteric thickening adjacent to the proximal
appendix. Suspect early acute appendicitis. No abscess or
perforation.

No bowel obstruction or bowel wall thickening.

Elsewhere no abscess or ascites. No renal or ureteral calculus. No
hydronephrosis.

Critical Value/emergent results were called by telephone at the time
of interpretation on 07/21/2015 at [DATE] to [facility], [facility] , who
verbally acknowledged these results.

## 2017-08-17 DIAGNOSIS — D171 Benign lipomatous neoplasm of skin and subcutaneous tissue of trunk: Secondary | ICD-10-CM | POA: Diagnosis not present

## 2017-08-17 DIAGNOSIS — Z0000 Encounter for general adult medical examination without abnormal findings: Secondary | ICD-10-CM | POA: Diagnosis not present

## 2017-08-17 DIAGNOSIS — Z23 Encounter for immunization: Secondary | ICD-10-CM | POA: Diagnosis not present

## 2018-05-24 DIAGNOSIS — K219 Gastro-esophageal reflux disease without esophagitis: Secondary | ICD-10-CM | POA: Diagnosis not present

## 2019-11-06 ENCOUNTER — Ambulatory Visit: Payer: Self-pay | Admitting: General Surgery

## 2019-11-07 ENCOUNTER — Encounter (HOSPITAL_BASED_OUTPATIENT_CLINIC_OR_DEPARTMENT_OTHER): Payer: Self-pay | Admitting: General Surgery

## 2019-11-07 ENCOUNTER — Other Ambulatory Visit: Payer: Self-pay

## 2019-11-07 NOTE — Progress Notes (Signed)
Spoke w/ via phone for pre-op interview---Matthew Schmidt needs dos----     none         COVID test ------11-10-2019 Arrive at -------1200 pm 11-13-2019 NPO after ------midnight Medications to take morning of surgery -----none Diabetic medication -----n/a Patient Special Instructions ----- Pre-Op special Istructions ----- Patient verbalized understanding of instructions that were given at this phone interview. Patient denies shortness of breath, chest pain, fever, cough a this phone interview.

## 2019-11-10 ENCOUNTER — Other Ambulatory Visit (HOSPITAL_COMMUNITY)
Admission: RE | Admit: 2019-11-10 | Discharge: 2019-11-10 | Disposition: A | Discharge status: Home / Self Care | Payer: 59 | Source: Ambulatory Visit | Attending: General Surgery | Admitting: General Surgery

## 2019-11-10 DIAGNOSIS — Z01812 Encounter for preprocedural laboratory examination: Secondary | ICD-10-CM | POA: Insufficient documentation

## 2019-11-10 DIAGNOSIS — Z20822 Contact with and (suspected) exposure to covid-19: Secondary | ICD-10-CM | POA: Insufficient documentation

## 2019-11-10 LAB — SARS CORONAVIRUS 2 (TAT 6-24 HRS): SARS Coronavirus 2: NEGATIVE

## 2019-11-13 ENCOUNTER — Encounter (HOSPITAL_BASED_OUTPATIENT_CLINIC_OR_DEPARTMENT_OTHER): Payer: Self-pay | Admitting: General Surgery

## 2019-11-13 ENCOUNTER — Other Ambulatory Visit: Payer: Self-pay

## 2019-11-13 ENCOUNTER — Encounter (HOSPITAL_BASED_OUTPATIENT_CLINIC_OR_DEPARTMENT_OTHER)
Admission: RE | Disposition: A | Discharge status: Home / Self Care | Payer: Self-pay | Source: Home / Self Care | Attending: General Surgery

## 2019-11-13 ENCOUNTER — Ambulatory Visit (HOSPITAL_BASED_OUTPATIENT_CLINIC_OR_DEPARTMENT_OTHER)
Admission: RE | Admit: 2019-11-13 | Discharge: 2019-11-13 | Disposition: A | Discharge status: Home / Self Care | Payer: 59 | Attending: General Surgery | Admitting: General Surgery

## 2019-11-13 ENCOUNTER — Ambulatory Visit (HOSPITAL_BASED_OUTPATIENT_CLINIC_OR_DEPARTMENT_OTHER): Payer: 59 | Admitting: Anesthesiology

## 2019-11-13 DIAGNOSIS — K644 Residual hemorrhoidal skin tags: Secondary | ICD-10-CM | POA: Insufficient documentation

## 2019-11-13 DIAGNOSIS — Z8719 Personal history of other diseases of the digestive system: Secondary | ICD-10-CM | POA: Insufficient documentation

## 2019-11-13 HISTORY — DX: Unspecified hemorrhoids: K64.9

## 2019-11-13 HISTORY — PX: HEMORRHOID SURGERY: SHX153

## 2019-11-13 SURGERY — HEMORRHOIDECTOMY
Anesthesia: General | Site: Rectum

## 2019-11-13 MED ORDER — GABAPENTIN 300 MG PO CAPS
ORAL_CAPSULE | ORAL | Status: AC
  Filled 2019-11-13: qty 1

## 2019-11-13 MED ORDER — OXYCODONE HCL 5 MG PO TABS
5.0000 mg | ORAL_TABLET | Freq: Once | ORAL | Status: AC | PRN
  Administered 2019-11-13: 5 mg via ORAL
  Filled 2019-11-13: qty 1

## 2019-11-13 MED ORDER — MIDAZOLAM HCL 2 MG/2ML IJ SOLN
INTRAMUSCULAR | Status: AC
  Filled 2019-11-13: qty 2

## 2019-11-13 MED ORDER — ACETAMINOPHEN 500 MG PO TABS
1000.0000 mg | ORAL_TABLET | ORAL | Status: AC
  Administered 2019-11-13: 1000 mg via ORAL
  Filled 2019-11-13: qty 2

## 2019-11-13 MED ORDER — OXYCODONE HCL 5 MG PO TABS: 5.0000 mg | ORAL_TABLET | Freq: Four times a day (QID) | ORAL | 0 refills | Status: DC | PRN

## 2019-11-13 MED ORDER — DEXAMETHASONE SODIUM PHOSPHATE 10 MG/ML IJ SOLN
INTRAMUSCULAR | Status: DC | PRN
  Administered 2019-11-13 (×2): 5 mg via INTRAVENOUS

## 2019-11-13 MED ORDER — BUPIVACAINE LIPOSOME 1.3 % IJ SUSP
20.0000 mL | Freq: Once | INTRAMUSCULAR | Status: DC
  Filled 2019-11-13: qty 20

## 2019-11-13 MED ORDER — LACTATED RINGERS IV SOLN
INTRAVENOUS | Status: DC
  Filled 2019-11-13: qty 1000

## 2019-11-13 MED ORDER — GABAPENTIN 300 MG PO CAPS
300.0000 mg | ORAL_CAPSULE | ORAL | Status: AC
  Administered 2019-11-13: 300 mg via ORAL
  Filled 2019-11-13: qty 1

## 2019-11-13 MED ORDER — ROCURONIUM BROMIDE 10 MG/ML (PF) SYRINGE
PREFILLED_SYRINGE | INTRAVENOUS | Status: AC
  Filled 2019-11-13: qty 10

## 2019-11-13 MED ORDER — CHLORHEXIDINE GLUCONATE CLOTH 2 % EX PADS
6.0000 | MEDICATED_PAD | Freq: Once | CUTANEOUS | Status: DC
  Filled 2019-11-13: qty 6

## 2019-11-13 MED ORDER — CEFAZOLIN SODIUM-DEXTROSE 2-4 GM/100ML-% IV SOLN
2.0000 g | INTRAVENOUS | Status: AC
  Administered 2019-11-13: 2 g via INTRAVENOUS
  Filled 2019-11-13: qty 100

## 2019-11-13 MED ORDER — SUGAMMADEX SODIUM 200 MG/2ML IV SOLN
INTRAVENOUS | Status: DC | PRN
  Administered 2019-11-13: 120 mg via INTRAVENOUS

## 2019-11-13 MED ORDER — ROCURONIUM BROMIDE 10 MG/ML (PF) SYRINGE
PREFILLED_SYRINGE | INTRAVENOUS | Status: DC | PRN
  Administered 2019-11-13: 50 mg via INTRAVENOUS

## 2019-11-13 MED ORDER — BUPIVACAINE LIPOSOME 1.3 % IJ SUSP
INTRAMUSCULAR | Status: DC | PRN
  Administered 2019-11-13: 20 mL

## 2019-11-13 MED ORDER — LIDOCAINE 2% (20 MG/ML) 5 ML SYRINGE
INTRAMUSCULAR | Status: AC
  Filled 2019-11-13: qty 5

## 2019-11-13 MED ORDER — ONDANSETRON HCL 4 MG/2ML IJ SOLN
INTRAMUSCULAR | Status: AC
  Filled 2019-11-13: qty 2

## 2019-11-13 MED ORDER — OXYCODONE HCL 5 MG PO TABS
ORAL_TABLET | ORAL | Status: AC
  Filled 2019-11-13: qty 1

## 2019-11-13 MED ORDER — OXYCODONE HCL 5 MG/5ML PO SOLN
5.0000 mg | Freq: Once | ORAL | Status: AC | PRN
  Filled 2019-11-13: qty 5

## 2019-11-13 MED ORDER — BUPIVACAINE HCL 0.5 % IJ SOLN
INTRAMUSCULAR | Status: DC | PRN
  Administered 2019-11-13: 30 mL

## 2019-11-13 MED ORDER — FENTANYL CITRATE (PF) 100 MCG/2ML IJ SOLN
INTRAMUSCULAR | Status: AC
  Filled 2019-11-13: qty 2

## 2019-11-13 MED ORDER — DOCUSATE SODIUM 100 MG PO CAPS: 100.0000 mg | ORAL_CAPSULE | Freq: Two times a day (BID) | ORAL | 0 refills | Status: AC

## 2019-11-13 MED ORDER — KETOROLAC TROMETHAMINE 30 MG/ML IJ SOLN
INTRAMUSCULAR | Status: AC
  Filled 2019-11-13: qty 1

## 2019-11-13 MED ORDER — IBUPROFEN 800 MG PO TABS: 800.0000 mg | ORAL_TABLET | Freq: Three times a day (TID) | ORAL | 0 refills | Status: DC | PRN

## 2019-11-13 MED ORDER — ACETAMINOPHEN 500 MG PO TABS
ORAL_TABLET | ORAL | Status: AC
  Filled 2019-11-13: qty 2

## 2019-11-13 MED ORDER — LIDOCAINE 2% (20 MG/ML) 5 ML SYRINGE
INTRAMUSCULAR | Status: DC | PRN
  Administered 2019-11-13: 60 mg via INTRAVENOUS

## 2019-11-13 MED ORDER — FENTANYL CITRATE (PF) 100 MCG/2ML IJ SOLN
INTRAMUSCULAR | Status: DC | PRN
  Administered 2019-11-13 (×2): 50 ug via INTRAVENOUS

## 2019-11-13 MED ORDER — CEFAZOLIN SODIUM-DEXTROSE 2-4 GM/100ML-% IV SOLN
INTRAVENOUS | Status: AC
  Filled 2019-11-13: qty 100

## 2019-11-13 MED ORDER — PROPOFOL 10 MG/ML IV BOLUS
INTRAVENOUS | Status: DC | PRN
  Administered 2019-11-13: 150 mg via INTRAVENOUS

## 2019-11-13 MED ORDER — ONDANSETRON HCL 4 MG/2ML IJ SOLN
4.0000 mg | Freq: Once | INTRAMUSCULAR | Status: DC | PRN
  Filled 2019-11-13: qty 2

## 2019-11-13 MED ORDER — MIDAZOLAM HCL 2 MG/2ML IJ SOLN
INTRAMUSCULAR | Status: DC | PRN
  Administered 2019-11-13: 2 mg via INTRAVENOUS

## 2019-11-13 MED ORDER — ONDANSETRON HCL 4 MG/2ML IJ SOLN
INTRAMUSCULAR | Status: DC | PRN
  Administered 2019-11-13: 4 mg via INTRAVENOUS

## 2019-11-13 MED ORDER — DEXAMETHASONE SODIUM PHOSPHATE 10 MG/ML IJ SOLN
INTRAMUSCULAR | Status: AC
  Filled 2019-11-13: qty 1

## 2019-11-13 MED ORDER — FENTANYL CITRATE (PF) 100 MCG/2ML IJ SOLN
25.0000 ug | INTRAMUSCULAR | Status: DC | PRN
  Filled 2019-11-13: qty 1

## 2019-11-13 MED ORDER — MEPERIDINE HCL 25 MG/ML IJ SOLN
6.2500 mg | INTRAMUSCULAR | Status: DC | PRN
  Filled 2019-11-13: qty 1

## 2019-11-13 SURGICAL SUPPLY — 42 items
BLADE EXTENDED COATED 6.5IN (ELECTRODE) IMPLANT
BLADE HEX COATED 2.75 (ELECTRODE) ×2 IMPLANT
BLADE SURG 15 STRL LF DISP TIS (BLADE) ×1 IMPLANT
BLADE SURG 15 STRL SS (BLADE) ×1
BRIEF STRETCH FOR OB PAD LRG (UNDERPADS AND DIAPERS) ×2 IMPLANT
COVER BACK TABLE 60X90IN (DRAPES) ×2 IMPLANT
COVER MAYO STAND STRL (DRAPES) ×2 IMPLANT
COVER WAND RF STERILE (DRAPES) ×2 IMPLANT
DRAPE LAPAROTOMY 100X72 PEDS (DRAPES) ×2 IMPLANT
DRAPE UTILITY 15X26 TOWEL STRL (DRAPES) ×2 IMPLANT
DRSG PAD ABDOMINAL 8X10 ST (GAUZE/BANDAGES/DRESSINGS) ×2 IMPLANT
ELECT REM PT RETURN 9FT ADLT (ELECTROSURGICAL) ×2
ELECTRODE REM PT RTRN 9FT ADLT (ELECTROSURGICAL) ×1 IMPLANT
GAUZE SPONGE 4X4 12PLY STRL (GAUZE/BANDAGES/DRESSINGS) IMPLANT
GAUZE SPONGE 4X4 12PLY STRL LF (GAUZE/BANDAGES/DRESSINGS) ×2 IMPLANT
GLOVE BIOGEL PI IND STRL 7.0 (GLOVE) ×1 IMPLANT
GLOVE BIOGEL PI INDICATOR 7.0 (GLOVE) ×1
GLOVE SURG SS PI 7.0 STRL IVOR (GLOVE) ×2 IMPLANT
GOWN STRL REUS W/TWL LRG LVL3 (GOWN DISPOSABLE) ×4 IMPLANT
KIT SIGMOIDOSCOPE (SET/KITS/TRAYS/PACK) IMPLANT
KIT TURNOVER CYSTO (KITS) ×2 IMPLANT
NEEDLE HYPO 22GX1.5 SAFETY (NEEDLE) ×2 IMPLANT
NS IRRIG 500ML POUR BTL (IV SOLUTION) ×2 IMPLANT
PACK BASIN DAY SURGERY FS (CUSTOM PROCEDURE TRAY) ×2 IMPLANT
PENCIL BUTTON HOLSTER BLD 10FT (ELECTRODE) ×2 IMPLANT
SPONGE HEMORRHOID 8X3CM (HEMOSTASIS) ×2 IMPLANT
SPONGE SURGIFOAM ABS GEL 12-7 (HEMOSTASIS) IMPLANT
SUT CHROMIC 2 0 SH (SUTURE) IMPLANT
SUT CHROMIC 3 0 SH 27 (SUTURE) IMPLANT
SUT VIC AB 2-0 SH 27 (SUTURE)
SUT VIC AB 2-0 SH 27XBRD (SUTURE) IMPLANT
SUT VIC AB 3-0 SH 27 (SUTURE) ×4
SUT VIC AB 3-0 SH 27X BRD (SUTURE) ×4 IMPLANT
SUT VIC AB 4-0 P-3 18XBRD (SUTURE) IMPLANT
SUT VIC AB 4-0 P3 18 (SUTURE)
SUT VIC AB 4-0 SH 18 (SUTURE) IMPLANT
SYR BULB IRRIGATION 50ML (SYRINGE) ×2 IMPLANT
SYR CONTROL 10ML LL (SYRINGE) ×2 IMPLANT
TOWEL OR 17X26 10 PK STRL BLUE (TOWEL DISPOSABLE) ×2 IMPLANT
TRAY DSU PREP LF (CUSTOM PROCEDURE TRAY) ×2 IMPLANT
TUBE CONNECTING 12X1/4 (SUCTIONS) ×2 IMPLANT
YANKAUER SUCT BULB TIP NO VENT (SUCTIONS) ×2 IMPLANT

## 2019-11-13 NOTE — Anesthesia Postprocedure Evaluation (Signed)
Anesthesia Post Note  Patient: Matthew Schmidt  Procedure(s) Performed: HEMORRHOIDECTOMY (N/A Rectum)     Patient location during evaluation: PACU Anesthesia Type: General Level of consciousness: awake and alert Pain management: pain level controlled Vital Signs Assessment: post-procedure vital signs reviewed and stable Respiratory status: spontaneous breathing, nonlabored ventilation and respiratory function stable Cardiovascular status: blood pressure returned to baseline and stable Postop Assessment: no apparent nausea or vomiting Anesthetic complications: no    Last Vitals:  Vitals:   11/13/19 1600 11/13/19 1700  BP: (!) 131/99 (!) 130/93  Pulse: 80 72  Resp: 14 14  Temp:  (!) 36.4 C  SpO2: 100% 97%    Last Pain:  Vitals:   11/13/19 1630  TempSrc:   PainSc: 0-No pain                 Lynda Rainwater

## 2019-11-13 NOTE — Discharge Instructions (Signed)
Surgical Procedures for Hemorrhoids, Care After This sheet gives you information about how to care for yourself after your procedure. Your health care provider may also give you more specific instructions. If you have problems or questions, contact your health care provider. What can I expect after the procedure? After the procedure, it is common to have:  Rectal pain.  Pain when you are having a bowel movement.  Slight rectal bleeding. This is more likely to happen with the first bowel movement after surgery. Follow these instructions at home: Medicines  Take over-the-counter and prescription medicines only as told by your health care provider.  If you were prescribed an antibiotic medicine, use it as told by your health care provider. Do not stop using the antibiotic even if your condition improves.  Ask your health care provider if the medicine prescribed to you requires you to avoid driving or using heavy machinery.  Use a stool softener or a bulk laxative as told by your health care provider. Eating and drinking  Follow instructions from your health care provider about what to eat or drink after your procedure.  You may need to take actions to prevent or treat constipation, such as: ? Drink enough fluid to keep your urine pale yellow. ? Take over-the-counter or prescription medicines. ? Eat foods that are high in fiber, such as beans, whole grains, and fresh fruits and vegetables. ? Limit foods that are high in fat and processed sugars, such as fried or sweet foods. Activity   Rest as told by your health care provider.  Avoid sitting for a long time without moving. Get up to take short walks every 1-2 hours. This is important to improve blood flow and breathing. Ask for help if you feel weak or unsteady.  Return to your normal activities as told by your health care provider. Ask your health care provider what activities are safe for you.  Do not lift anything that is  heavier than 10 lb (4.5 kg), or the limit that you are told, until your health care provider says that it is safe.  Do not strain to have a bowel movement.  Do not spend a long time sitting on the toilet. General instructions   Take warm sitz baths for 15-20 minutes, 2-3 times a day to relieve soreness or itching and to keep the rectal area clean.  Apply ice packs to the area to reduce swelling and pain.  Do not drive for 24 hours if you were given a sedative during your procedure.  Keep all follow-up visits as told by your health care provider. This is important. Contact a health care provider if:  Your pain medicine is not helping.  You have a fever or chills.  You have bad smelling drainage.  You have a lot of swelling.  You become constipated.  You have trouble passing urine. Get help right away if:  You have very bad rectal pain.  You have heavy bleeding from your rectum. Summary  After the procedure, it is common to have pain and slight rectal bleeding.  Take warm sitz baths for 15-20 minutes, 2-3 times a day to relieve soreness or itching and to keep the rectal area clean.  Avoid straining when having a bowel movement.  Eat foods that are high in fiber, such as beans, whole grains, and fresh fruits and vegetables.  Take over-the-counter and prescription medicines only as told by your health care provider. This information is not intended to replace advice given to  you by your health care provider. Make sure you discuss any questions you have with your health care provider. Document Revised: 02/12/2019 Document Reviewed: 07/16/2018 Elsevier Patient Education  2020 [place] for Discharge Teaching: EXPAREL (bupivacaine liposome injectable suspension)   Your surgeon or anesthesiologist gave you EXPAREL(bupivacaine) to help control your pain after surgery.   EXPAREL is a local anesthetic that provides pain relief by numbing the tissue around  the surgical site.  EXPAREL is designed to release pain medication over time and can control pain for up to 72 hours.  Depending on how you respond to EXPAREL, you may require less pain medication during your recovery.  Possible side effects:  Temporary loss of sensation or ability to move in the area where bupivacaine was injected.  Nausea, vomiting, constipation  Rarely, numbness and tingling in your mouth or lips, lightheadedness, or anxiety may occur.  Call your doctor right away if you think you may be experiencing any of these sensations, or if you have other questions regarding possible side effects.  Follow all other discharge instructions given to you by your surgeon or nurse. Eat a healthy diet and drink plenty of water or other fluids.  If you return to the hospital for any reason within 96 hours following the administration of EXPAREL, it is important for health care providers to know that you have received this anesthetic. A teal colored band has been placed on your arm with the date, time and amount of EXPAREL you have received in order to alert and inform your health care providers. Please leave this armband in place for the full 96 hours following administration, and then you may remove the band.   Post Anesthesia Home Care Instructions  Activity: Get plenty of rest for the remainder of the day. A responsible individual must stay with you for 24 hours following the procedure.  For the next 24 hours, DO NOT: -Drive a car -Paediatric nurse -Drink alcoholic beverages -Take any medication unless instructed by your physician -Make any legal decisions or sign important papers.  Meals: Start with liquid foods such as gelatin or soup. Progress to regular foods as tolerated. Avoid greasy, spicy, heavy foods. If nausea and/or vomiting occur, drink only clear liquids until the nausea and/or vomiting subsides. Call your physician if vomiting continues.  Special  Instructions/Symptoms: Your throat may feel dry or sore from the anesthesia or the breathing tube placed in your throat during surgery. If this causes discomfort, gargle with warm salt water. The discomfort should disappear within 24 hours.

## 2019-11-13 NOTE — Anesthesia Preprocedure Evaluation (Addendum)
Anesthesia Evaluation  Patient identified by MRN, date of birth, ID band Patient awake    Reviewed: Allergy & Precautions, NPO status , Patient's Chart, lab work & pertinent test results  Airway Mallampati: II  TM Distance: >3 FB Neck ROM: Full    Dental no notable dental hx. (+) Teeth Intact   Pulmonary neg pulmonary ROS,    Pulmonary exam normal breath sounds clear to auscultation       Cardiovascular negative cardio ROS Normal cardiovascular exam Rhythm:Regular Rate:Normal     Neuro/Psych negative neurological ROS  negative psych ROS   GI/Hepatic Neg liver ROS, Hemorrhoids   Endo/Other  negative endocrine ROS  Renal/GU negative Renal ROS  negative genitourinary   Musculoskeletal negative musculoskeletal ROS (+)   Abdominal   Peds  Hematology negative hematology ROS (+)   Anesthesia Other Findings   Reproductive/Obstetrics                             Anesthesia Physical Anesthesia Plan  ASA: I  Anesthesia Plan: General   Post-op Pain Management:    Induction: Intravenous  PONV Risk Score and Plan: 4 or greater and Midazolam, Ondansetron, Dexamethasone and Treatment may vary due to age or medical condition  Airway Management Planned: LMA and Oral ETT  Additional Equipment:   Intra-op Plan:   Post-operative Plan: Extubation in OR  Informed Consent: I have reviewed the patients History and Physical, chart, labs and discussed the procedure including the risks, benefits and alternatives for the proposed anesthesia with the patient or authorized representative who has indicated his/her understanding and acceptance.     Dental advisory given  Plan Discussed with: CRNA, Surgeon and Anesthesiologist  Anesthesia Plan Comments:         Anesthesia Quick Evaluation

## 2019-11-13 NOTE — Transfer of Care (Signed)
Immediate Anesthesia Transfer of Care Note  Patient: Matthew Schmidt  Procedure(s) Performed: Procedure(s) (LRB): HEMORRHOIDECTOMY (N/A)  Patient Location: PACU  Anesthesia Type: General  Level of Consciousness: awake, oriented, sedated and patient cooperative  Airway & Oxygen Therapy: Patient Spontanous Breathing and Patient connected to face mask oxygen  Post-op Assessment: Report given to PACU RN and Post -op Vital signs reviewed and stable  Post vital signs: Reviewed and stable  Complications: No apparent anesthesia complications  Last Vitals:  Vitals Value Taken Time  BP 136/102 11/13/19 1538  Temp    Pulse 83 11/13/19 1538  Resp 19 11/13/19 1538  SpO2 100 % 11/13/19 1538  Vitals shown include unvalidated device data.  Last Pain:  Vitals:   11/13/19 1242  TempSrc: Oral  PainSc: 2       Patients Stated Pain Goal: 6 (11/13/19 1242)

## 2019-11-13 NOTE — H&P (Signed)
Matthew Schmidt is an 39 y.o. male.   Chief Complaint: hemorrhoids HPI: 39 yo male with multiple year history of hemorrhoids with bleeding and irritation.  Past Medical History:  Diagnosis Date  . Hemorrhoids     Past Surgical History:  Procedure Laterality Date  . LAPAROSCOPIC APPENDECTOMY N/A 07/21/2015   Procedure: APPENDECTOMY LAPAROSCOPIC;  Surgeon: Judeth Horn, MD;  Location: [facility];  Service: General;  Laterality: N/A;    History reviewed. No pertinent family history. Social History:  reports that he has never smoked. He has never used smokeless tobacco. He reports current alcohol use. He reports that he does not use drugs.  Allergies: No Known Allergies  Medications Prior to Admission  Medication Sig Dispense Refill  . nitroGLYCERIN (NITROGLYN) 2 % OINT ointment Apply topically as needed. For hemorrhoids      No results found for this or any previous visit (from the past 48 hour(s)). No results found.  Review of Systems  Constitutional: Negative for chills and fever.  HENT: Negative for hearing loss.   Respiratory: Negative for cough.   Cardiovascular: Negative for chest pain and palpitations.  Gastrointestinal: Negative for abdominal pain, nausea and vomiting.  Genitourinary: Negative for dysuria and urgency.  Musculoskeletal: Negative for myalgias and neck pain.  Skin: Negative for rash.  Neurological: Negative for dizziness and headaches.  Hematological: Does not bruise/bleed easily.  Psychiatric/Behavioral: Negative for suicidal ideas.    Blood pressure 120/83, pulse 64, temperature 98.2 F (36.8 C), temperature source Oral, resp. rate 14, height 5\' 5"  (1.651 m), weight 61.1 kg, SpO2 100 %. Physical Exam  Nursing note and vitals reviewed. Constitutional: He is oriented to person, place, and time. He appears well-developed and well-nourished.  HENT:  Head: Normocephalic and atraumatic.  Eyes: Conjunctivae and EOM are normal. No scleral icterus.   Cardiovascular: Normal rate and regular rhythm.  Respiratory: Effort normal and breath sounds normal. He has no wheezes. He has no rales. He exhibits no tenderness.  GI: Soft. He exhibits no distension. There is no abdominal tenderness. There is no rebound.  Musculoskeletal:        General: No edema. Normal range of motion.     Cervical back: Normal range of motion and neck supple.  Neurological: He is alert and oriented to person, place, and time.  Skin: Skin is warm and dry.  Psychiatric: He has a normal mood and affect. His behavior is normal.     Assessment/Plan 39 yo male with external hemorrhoids -excisional hemorrhoidectomy -ERAS protocol -planned outpatient procedure  [name], MD 11/13/2019, 2:07 PM

## 2019-11-13 NOTE — Op Note (Signed)
Preoperative diagnosis: External hemorrhoid  Postoperative diagnosis: same   Procedure: anal exam under anesthesia and excisional hemorrhoidectomy  Surgeon: Gurney Maxin, M.D.  Asst: none  Anesthesia: General anesthesia  Indications for procedure: Matthew Schmidt is a 39 y.o. year old male with symptoms of pain and bleeding from large hemorrhoids.  Description of procedure: The patient was brought into the operative suite. Anesthesia was administered with general anesthesia. WHO checklist was applied. The patient was then placed in prone position. The area was prepped and draped in the usual sterile fashion.  Next, Marcaine/Exparel mix was injected into the perianal tissue. Digital palpation was performed followed by inspection of the canal with retractor. There was a large left lateral and right posterior hemorrhoid as well as a medium posterior lateral hemorrhoid, and medium right anterior pile.  The left lateral hemorrhoid was retracted and clamped. A 3-0 vicryl was placed at the base and then the hemorrhoid was removed with cautery. The mucosa and skin edges were apposed longitudinally with the 3-0 vicryl in running locking fashion. One additional 3-0 vicryl was placed for hemostasis at the base of the hemorrhoid  Next, the right posterior hemorrhoid was retracted and clamped. A 3-0 vicryl was placed at the base and then the hemorrhoid was removed with cautery. The mucosa and skin edges were apposed longitudinally with the 3-0 vicryl in running locking fashion.  The Medium right posterior hemorrhoid was retracted and clamped. A 3-0 vicryl was placed at the base and then the hemorrhoid was removed with cautery. The mucosa and skin edges were apposed longitudinally with the 3-0 vicryl in running locking fashion.  Due to the amount of tissue removed, I did not think it would be a good idea to remove the anterior pile. The canal was irrigated. Hemostasis appeared intact. A surgifoam was  placed into the canal. The patient awoke from anesthesia and was brought to pacu in stable condition.  Findings: multiple large external hemorrhoids  Specimen: hemorrhoids  Implant: surgifoam   Blood loss: 20 ml  Local anesthesia: 50 ml of Exparel:Marcaine Mix  Complications: none  Gurney Maxin, M.D. General, Bariatric, & Minimally Invasive [facility]

## 2019-11-13 NOTE — Anesthesia Procedure Notes (Signed)
Procedure Name: Intubation Date/Time: 11/13/2019 2:26 PM Performed by: Suan Halter, CRNA Pre-anesthesia Checklist: Patient identified, Emergency Drugs available, Suction available and Patient being monitored Patient Re-evaluated:Patient Re-evaluated prior to induction Oxygen Delivery Method: Circle system utilized Preoxygenation: Pre-oxygenation with 100% oxygen Induction Type: IV induction Ventilation: Mask ventilation without difficulty Laryngoscope Size: Mac and 3 Grade View: Grade I Tube type: Oral Tube size: 7.0 mm Number of attempts: 1 Airway Equipment and Method: Stylet and Oral airway Placement Confirmation: ETT inserted through vocal cords under direct vision,  positive ETCO2 and breath sounds checked- equal and bilateral Secured at: 22 cm Tube secured with: Tape Dental Injury: Teeth and Oropharynx as per pre-operative assessment

## 2019-11-14 LAB — SURGICAL PATHOLOGY

## 2020-03-25 ENCOUNTER — Telehealth: Payer: Self-pay

## 2020-03-25 NOTE — Telephone Encounter (Signed)
Patient was referred by his wife Ria Clock MRN: [id number]. Patient is wondering if you would accept him as a  Patient.

## 2020-03-26 NOTE — Telephone Encounter (Signed)
Called pt to scheduled appointment, unable to leave message no answering machine

## 2020-03-26 NOTE — Telephone Encounter (Signed)
OK to sched. Ty.  

## 2020-06-18 ENCOUNTER — Ambulatory Visit: Payer: 59 | Admitting: Family Medicine

## 2020-06-29 ENCOUNTER — Ambulatory Visit (INDEPENDENT_AMBULATORY_CARE_PROVIDER_SITE_OTHER): Payer: 59 | Admitting: Family Medicine

## 2020-06-29 ENCOUNTER — Other Ambulatory Visit: Payer: Self-pay

## 2020-06-29 ENCOUNTER — Encounter: Payer: Self-pay | Admitting: Family Medicine

## 2020-06-29 VITALS — BP 102/62 | HR 60 | Temp 97.6°F | Ht 65.0 in | Wt 132.5 lb

## 2020-06-29 DIAGNOSIS — Z23 Encounter for immunization: Secondary | ICD-10-CM

## 2020-06-29 DIAGNOSIS — Z114 Encounter for screening for human immunodeficiency virus [HIV]: Secondary | ICD-10-CM | POA: Diagnosis not present

## 2020-06-29 DIAGNOSIS — Z1159 Encounter for screening for other viral diseases: Secondary | ICD-10-CM

## 2020-06-29 DIAGNOSIS — Z0000 Encounter for general adult medical examination without abnormal findings: Secondary | ICD-10-CM | POA: Diagnosis not present

## 2020-06-29 NOTE — Progress Notes (Signed)
Chief Complaint  Patient presents with  . New Patient (Initial Visit)    Well Male Matthew Schmidt is here for a complete physical.   His last physical was >1 year ago.  Current diet: in general, a "healthy" diet.   Current exercise: tennis, some lifting Weight trend: stable Fatigue out of ordinary? No. Seat belt? Yes.    Health maintenance Tetanus- No HIV- No Hep C- No  Past Medical History:  Diagnosis Date  . Hemorrhoids      Past Surgical History:  Procedure Laterality Date  . HEMORRHOID SURGERY N/A 11/13/2019   Procedure: HEMORRHOIDECTOMY;  Surgeon: Kinsinger, Arta Bruce, MD;  Location: [facility];  Service: General;  Laterality: N/A;  . LAPAROSCOPIC APPENDECTOMY N/A 07/21/2015   Procedure: APPENDECTOMY LAPAROSCOPIC;  Surgeon: Judeth Horn, MD;  Location: [place];  Service: General;  Laterality: N/A;    Medications  Takes no meds routinely.   Allergies No Known Allergies  Family History Family History  Problem Relation Age of Onset  . Thyroid disease Mother     Review of Systems: Constitutional: no fevers or chills Eye:  no recent significant change in vision Ear/Nose/Mouth/Throat:  Ears:  no hearing loss Nose/Mouth/Throat:  no complaints of nasal congestion, no sore throat Cardiovascular:  no chest pain Respiratory:  no shortness of breath Gastrointestinal:  no abdominal pain, no change in bowel habits GU:  Male: negative for dysuria Musculoskeletal/Extremities:  no pain of the joints Integumentary (Skin/Breast):  no abnormal skin lesions reported Neurologic:  no headaches Endocrine: No unexpected weight changes Hematologic/Lymphatic:  no night sweats  Exam BP 102/62 (BP Location: Left Arm, Patient Position: Sitting, Cuff Size: Normal)   Pulse 60   Temp 97.6 F (36.4 C) (Oral)   Ht 5\' 5"  (1.651 m)   Wt 132 lb 8 oz (60.1 kg)   SpO2 99%   BMI 22.05 kg/m  General:  well developed, well nourished, in no apparent distress Skin:  no  significant moles, warts, or growths Head:  no masses, lesions, or tenderness Eyes:  pupils equal and round, sclera anicteric without injection Ears:  canals without lesions, TMs shiny without retraction, no obvious effusion, no erythema Nose:  nares patent, septum midline, mucosa normal Throat/Pharynx:  lips and gingiva without lesion; tongue and uvula midline; non-inflamed pharynx; no exudates or postnasal drainage Neck: neck supple without adenopathy, thyromegaly, or masses Lungs:  clear to auscultation, breath sounds equal bilaterally, no respiratory distress Cardio:  regular rate and rhythm, no bruits, no LE edema Abdomen:  abdomen soft, nontender; bowel sounds normal; no masses or organomegaly Genital (male): Deferred Rectal: Deferred Musculoskeletal:  symmetrical muscle groups noted without atrophy or deformity Extremities:  no clubbing, cyanosis, or edema, no deformities, no skin discoloration Neuro:  gait normal; deep tendon reflexes normal and symmetric Psych: well oriented with normal range of affect and appropriate judgment/insight  Assessment and Plan  Well adult exam - Plan: Comprehensive metabolic panel, CBC, Lipid panel  Screening for HIV (human immunodeficiency virus) - Plan: HIV Antibody (routine testing w rflx)  Encounter for hepatitis C screening test for low risk patient - Plan: Hepatitis C antibody  Need for influenza vaccination - Plan: Flu Vaccine QUAD 6+ mos PF IM (Fluarix Quad PF)  Need for Tdap vaccination - Plan: Tdap vaccine greater than or equal to 4yo IM   Well 40 y.o. male. Counseled on diet and exercise. Self testicular exams recommended at least monthly.  Other orders as above. Follow up in 1 year pending  the above workup. The patient voiced understanding and agreement to the plan.  [place], DO 06/29/20 10:06 AM

## 2020-06-29 NOTE — Patient Instructions (Addendum)
Give [place] 2-3 business days to get the results of your labs back.   Keep the diet clean and stay active.  Do monthly self testicular checks in the shower. You are feeling for lumps/bumps that don't belong. If you feel anything like this, let me know! You can stop when you turn 40.   Let us know if you need anything.

## 2020-06-30 LAB — COMPREHENSIVE METABOLIC PANEL
AG Ratio: 2.1 (calc) (ref 1.0–2.5)
ALT: 17 U/L (ref 9–46)
AST: 22 U/L (ref 10–40)
Albumin: 4.8 g/dL (ref 3.6–5.1)
Alkaline phosphatase (APISO): 68 U/L (ref 36–130)
BUN: 16 mg/dL (ref 7–25)
CO2: 26 mmol/L (ref 20–32)
Calcium: 10.1 mg/dL (ref 8.6–10.3)
Chloride: 102 mmol/L (ref 98–110)
Creat: 1.04 mg/dL (ref 0.60–1.35)
Globulin: 2.3 g/dL (calc) (ref 1.9–3.7)
Glucose, Bld: 89 mg/dL (ref 65–99)
Potassium: 4.7 mmol/L (ref 3.5–5.3)
Sodium: 139 mmol/L (ref 135–146)
Total Bilirubin: 1 mg/dL (ref 0.2–1.2)
Total Protein: 7.1 g/dL (ref 6.1–8.1)

## 2020-06-30 LAB — CBC
HCT: 46.1 % (ref 38.5–50.0)
Hemoglobin: 14.5 g/dL (ref 13.2–17.1)
MCH: 25.7 pg — ABNORMAL LOW (ref 27.0–33.0)
MCHC: 31.5 g/dL — ABNORMAL LOW (ref 32.0–36.0)
MCV: 81.7 fL (ref 80.0–100.0)
MPV: 12.3 fL (ref 7.5–12.5)
Platelets: 196 10*3/uL (ref 140–400)
RBC: 5.64 10*6/uL (ref 4.20–5.80)
RDW: 13 % (ref 11.0–15.0)
WBC: 4.6 10*3/uL (ref 3.8–10.8)

## 2020-06-30 LAB — HEPATITIS C ANTIBODY
Hepatitis C Ab: NONREACTIVE
SIGNAL TO CUT-OFF: 0.01 (ref <1.00)

## 2020-06-30 LAB — HIV ANTIBODY (ROUTINE TESTING W REFLEX): HIV 1&2 Ab, 4th Generation: NONREACTIVE

## 2020-06-30 LAB — LIPID PANEL
Cholesterol: 183 mg/dL (ref <200)
HDL: 63 mg/dL (ref >=40)
LDL Cholesterol (Calc): 106 mg/dL (calc) — ABNORMAL HIGH
Non-HDL Cholesterol (Calc): 120 mg/dL (calc) (ref <130)
Total CHOL/HDL Ratio: 2.9 (calc) (ref <5.0)
Triglycerides: 54 mg/dL (ref <150)

## 2020-08-12 ENCOUNTER — Ambulatory Visit (INDEPENDENT_AMBULATORY_CARE_PROVIDER_SITE_OTHER): Payer: 59 | Admitting: Family Medicine

## 2020-08-12 ENCOUNTER — Ambulatory Visit: Payer: 59 | Admitting: Family Medicine

## 2020-08-12 ENCOUNTER — Encounter: Payer: Self-pay | Admitting: Family Medicine

## 2020-08-12 ENCOUNTER — Other Ambulatory Visit: Payer: Self-pay

## 2020-08-12 VITALS — BP 120/62 | HR 84 | Temp 97.9°F | Resp 16 | Wt 135.8 lb

## 2020-08-12 DIAGNOSIS — R42 Dizziness and giddiness: Secondary | ICD-10-CM | POA: Diagnosis not present

## 2020-08-12 NOTE — Progress Notes (Signed)
Chief Complaint  Patient presents with  . Dizziness     with quck movements- tennis few seconds    Matthew Schmidt is 39 y.o. pt here for dizziness.  Duration: 1.5 mo happened for a few days and then resolved. Came back 3 d ago. He did Landscape architect.  Pass out? No Spinning? No  Lasting for a few seconds. Recent illness/fever? No Headache? No Neurologic signs? No Change in PO intake? No Palpitations? No   Past Medical History:  Diagnosis Date  . Hemorrhoids     Family History  Problem Relation Age of Onset  . Thyroid disease Mother     Allergies as of 08/12/2020   No Known Allergies     Medication List    as of August 12, 2020 10:10 AM   You have not been prescribed any medications.     BP 120/62 (BP Location: Right Arm, Patient Position: Sitting, Cuff Size: Normal)   Pulse 84   Temp 97.9 F (36.6 C) (Temporal)   Resp 16   Wt 135 lb 12.8 oz (61.6 kg)   SpO2 97%   BMI 22.60 kg/m  General: Awake, alert, appears stated age Eyes: PERRLA, EOMi Ears: Patent, TM's neg b/l Heart: RRR, no murmurs, no carotid bruits Lungs: CTAB, no accessory muscle use MSK: 5/5 strength throughout, gait normal Neuro: No cerebellar signs, patellar reflex 2/4 b/l wo clonus, calcaneal reflex 0/4 b/l wo clonus, biceps reflex 1/4 b/l wo clonus; Dix-Hall-Pike negative b/l. 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 Rec'd INCS 5-7 d prior to flights. Also focus on maintaining adequate fluid intake on flights. Doubt arrhythmia, neurologic issue, vertigo, carotid blockage, migraine.  F/u prn. Pt voiced understanding and agreement to the plan.  [place], DO 08/12/20 10:10 AM

## 2020-08-12 NOTE — Patient Instructions (Signed)
Consider using Flonase daily 5-7 days prior to your next flight until you are back.  Try to drink 55-60 oz of water daily outside of exercise.  Let us know if you need anything.

## 2021-03-23 ENCOUNTER — Telehealth: Payer: Self-pay

## 2021-03-23 NOTE — Telephone Encounter (Signed)
Caller needs to schedule an appointment to show the doctor some toenail issues.  Telephone: [phone]

## 2021-03-23 NOTE — Telephone Encounter (Signed)
Called the patient and scheduled an appointment for Friday 03/25/21 at 3:30

## 2021-03-25 ENCOUNTER — Other Ambulatory Visit: Payer: Self-pay

## 2021-03-25 ENCOUNTER — Ambulatory Visit (INDEPENDENT_AMBULATORY_CARE_PROVIDER_SITE_OTHER): Payer: Managed Care, Other (non HMO) | Admitting: Family Medicine

## 2021-03-25 ENCOUNTER — Encounter: Payer: Self-pay | Admitting: Family Medicine

## 2021-03-25 VITALS — BP 112/82 | HR 61 | Temp 98.2°F | Ht 65.0 in | Wt 137.4 lb

## 2021-03-25 DIAGNOSIS — B351 Tinea unguium: Secondary | ICD-10-CM

## 2021-03-25 MED ORDER — TERBINAFINE HCL 250 MG PO TABS: 250.0000 mg | ORAL_TABLET | Freq: Every day | ORAL | 3 refills | Status: DC

## 2021-03-25 NOTE — Progress Notes (Signed)
Chief Complaint  Patient presents with   toenail problems    Matthew Schmidt is a 40 y.o. male here for a nail complaint.  Duration: several months Location: Toenails Pruritic? No Painful? No Drainage? No New soaps/lotions/topicals/detergents? No Sick contacts? No Other associated symptoms: Bruising under his nails Therapies tried thus far: None He plays tennis frequently.  Past Medical History:  Diagnosis Date   Hemorrhoids     BP 112/82   Pulse 61   Temp 98.2 F (36.8 C) (Oral)   Ht 5\' 5"  (1.651 m)   Wt 137 lb 6 oz (62.3 kg)   SpO2 97%   BMI 22.86 kg/m  Gen: awake, alert, appearing stated age Lungs: No accessory muscle use Skin: Thickened and hypertrophic/hyperkeratotic nails bilaterally sparing the pinkies.  No curvature of the nails or pain of the periungual tissue.  There is no erythema or purulent drainage.  Under various nails there is presence of a hematoma. Psych: Age appropriate judgment and insight  Onychomycosis - Plan: terbinafine (LAMISIL) 250 MG tablet  Patient will start Lamisil daily.  We will recheck his liver function at his physical in 3 months.  Consider topical Vicks VapoRub as an alternative.  Try to keep things clean and dry. F/u as originally scheduled. The patient voiced understanding and agreement to the plan.  [place], DO 03/25/21 3:41 PM

## 2021-03-25 NOTE — Patient Instructions (Signed)
OK to try daily Vick's Vaporub.  Let us know if you need anything.

## 2021-07-04 ENCOUNTER — Other Ambulatory Visit: Payer: Self-pay

## 2021-07-04 ENCOUNTER — Encounter: Payer: Self-pay | Admitting: Family Medicine

## 2021-07-04 ENCOUNTER — Ambulatory Visit (INDEPENDENT_AMBULATORY_CARE_PROVIDER_SITE_OTHER): Payer: Managed Care, Other (non HMO) | Admitting: Family Medicine

## 2021-07-04 VITALS — BP 108/62 | HR 60 | Temp 97.6°F | Ht 64.0 in | Wt 133.4 lb

## 2021-07-04 DIAGNOSIS — Z0000 Encounter for general adult medical examination without abnormal findings: Secondary | ICD-10-CM | POA: Diagnosis not present

## 2021-07-04 LAB — COMPREHENSIVE METABOLIC PANEL
ALT: 15 U/L (ref 0–53)
AST: 22 U/L (ref 0–37)
Albumin: 4.6 g/dL (ref 3.5–5.2)
Alkaline Phosphatase: 64 U/L (ref 39–117)
BUN: 12 mg/dL (ref 6–23)
CO2: 31 mEq/L (ref 19–32)
Calcium: 9.8 mg/dL (ref 8.4–10.5)
Chloride: 103 mEq/L (ref 96–112)
Creatinine, Ser: 1.1 mg/dL (ref 0.40–1.50)
GFR: 83.95 mL/min (ref >60.00)
Glucose, Bld: 91 mg/dL (ref 70–99)
Potassium: 4.2 mEq/L (ref 3.5–5.1)
Sodium: 139 mEq/L (ref 135–145)
Total Bilirubin: 1 mg/dL (ref 0.2–1.2)
Total Protein: 6.9 g/dL (ref 6.0–8.3)

## 2021-07-04 LAB — CBC
HCT: 42.6 % (ref 39.0–52.0)
Hemoglobin: 13.7 g/dL (ref 13.0–17.0)
MCHC: 32.2 g/dL (ref 30.0–36.0)
MCV: 80.1 fl (ref 78.0–100.0)
Platelets: 165 10*3/uL (ref 150.0–400.0)
RBC: 5.32 Mil/uL (ref 4.22–5.81)
RDW: 13.1 % (ref 11.5–15.5)
WBC: 3.9 10*3/uL — ABNORMAL LOW (ref 4.0–10.5)

## 2021-07-04 LAB — LIPID PANEL
Cholesterol: 184 mg/dL (ref 0–200)
HDL: 57.4 mg/dL (ref >39.00)
LDL Cholesterol: 114 mg/dL — ABNORMAL HIGH (ref 0–99)
NonHDL: 126.14
Total CHOL/HDL Ratio: 3
Triglycerides: 62 mg/dL (ref 0.0–149.0)
VLDL: 12.4 mg/dL (ref 0.0–40.0)

## 2021-07-04 NOTE — Progress Notes (Signed)
Chief Complaint  Patient presents with   Annual Exam    Well Male Matthew Schmidt is here for a complete physical.   His last physical was >1 year ago.  Current diet: in general, a "healthy" diet.   Current exercise: tennis, wt lifting Weight trend: stable Fatigue out of ordinary? No. Seat belt? Yes.    Health maintenance Tetanus- Yes HIV- Yes Hep C- Yes  Past Medical History:  Diagnosis Date   Hemorrhoids      Past Surgical History:  Procedure Laterality Date   HEMORRHOID SURGERY N/A 11/13/2019   Procedure: HEMORRHOIDECTOMY;  Surgeon: Kinsinger, Arta Bruce, MD;  Location: [place];  Service: General;  Laterality: N/A;   LAPAROSCOPIC APPENDECTOMY N/A 07/21/2015   Procedure: APPENDECTOMY LAPAROSCOPIC;  Surgeon: Judeth Horn, MD;  Location: [place];  Service: General;  Laterality: N/A;    Medications  Current Outpatient Medications on File Prior to Visit  Medication Sig Dispense Refill   terbinafine (LAMISIL) 250 MG tablet Take 1 tablet (250 mg total) by mouth daily. 30 tablet 3    Allergies No Known Allergies  Family History Family History  Problem Relation Age of Onset   Thyroid disease Mother     Review of Systems: Constitutional: no fevers or chills Eye:  no recent significant change in vision Ear/Nose/Mouth/Throat:  Ears:  no hearing loss Nose/Mouth/Throat:  no complaints of nasal congestion, no sore throat Cardiovascular:  no chest pain Respiratory:  no shortness of breath Gastrointestinal:  no abdominal pain, no change in bowel habits GU:  Male: negative for dysuria, frequency, and incontinence Musculoskeletal/Extremities:  no new pain of the joints Integumentary (Skin/Breast):  no new abnormal skin lesions reported Neurologic:  no headaches Endocrine: No unexpected weight changes Hematologic/Lymphatic:  no night sweats  Exam BP 108/62   Pulse 60   Temp 97.6 F (36.4 C) (Oral)   Ht 5\' 4"  (1.626 m)   Wt 133 lb 6 oz (60.5 kg)    SpO2 99%   BMI 22.89 kg/m  General:  well developed, well nourished, in no apparent distress Skin:  no significant moles, warts, or growths Head:  no masses, lesions, or tenderness Eyes:  pupils equal and round, sclera anicteric without injection Ears:  canals without lesions, TMs shiny without retraction, no obvious effusion, no erythema Nose:  nares patent, septum midline, mucosa normal Throat/Pharynx:  lips and gingiva without lesion; tongue and uvula midline; non-inflamed pharynx; no exudates or postnasal drainage Neck: neck supple without adenopathy, thyromegaly, or masses Lungs:  clear to auscultation, breath sounds equal bilaterally, no respiratory distress Cardio:  regular rate and rhythm, no bruits, no LE edema Abdomen:  abdomen soft, nontender; bowel sounds normal; no masses or organomegaly Rectal: Deferred Musculoskeletal:  symmetrical muscle groups noted without atrophy or deformity Extremities:  no clubbing, cyanosis, or edema, no deformities, no skin discoloration Neuro:  gait normal; deep tendon reflexes normal and symmetric Psych: well oriented with normal range of affect and appropriate judgment/insight  Assessment and Plan  Well adult exam - Plan: CBC, Comprehensive metabolic panel, Lipid panel   Well 40 y.o. male. Counseled on diet and exercise. Counseled on risks and benefits of prostate cancer screening with PSA. The patient agrees to forego screening.  Other orders as above. Follow up in 1 yr pending the above workup. The patient voiced understanding and agreement to the plan.  [place], DO 07/04/21 8:28 AM

## 2021-07-04 NOTE — Patient Instructions (Addendum)
Give [place] 2-3 business days to get the results of your labs back.   Keep the diet clean and stay active.  I recommend getting the updated bivalent covid vaccination booster at your convenience.   Let us know if you need anything.

## 2021-08-25 ENCOUNTER — Ambulatory Visit (INDEPENDENT_AMBULATORY_CARE_PROVIDER_SITE_OTHER): Payer: Managed Care, Other (non HMO)

## 2021-08-25 ENCOUNTER — Encounter: Payer: Self-pay | Admitting: Family Medicine

## 2021-08-25 DIAGNOSIS — Z23 Encounter for immunization: Secondary | ICD-10-CM | POA: Diagnosis not present

## 2022-10-27 ENCOUNTER — Encounter: Payer: Self-pay | Admitting: Family Medicine

## 2022-10-27 ENCOUNTER — Ambulatory Visit (INDEPENDENT_AMBULATORY_CARE_PROVIDER_SITE_OTHER): Payer: Managed Care, Other (non HMO) | Admitting: Family Medicine

## 2022-10-27 VITALS — BP 108/62 | HR 61 | Temp 97.9°F | Ht 65.0 in | Wt 136.2 lb

## 2022-10-27 DIAGNOSIS — Z0000 Encounter for general adult medical examination without abnormal findings: Secondary | ICD-10-CM | POA: Diagnosis not present

## 2022-10-27 DIAGNOSIS — Z13220 Encounter for screening for lipoid disorders: Secondary | ICD-10-CM | POA: Diagnosis not present

## 2022-10-27 LAB — COMPREHENSIVE METABOLIC PANEL
ALT: 16 U/L (ref 0–53)
AST: 19 U/L (ref 0–37)
Albumin: 4.6 g/dL (ref 3.5–5.2)
Alkaline Phosphatase: 63 U/L (ref 39–117)
BUN: 16 mg/dL (ref 6–23)
CO2: 33 mEq/L — ABNORMAL HIGH (ref 19–32)
Calcium: 10.1 mg/dL (ref 8.4–10.5)
Chloride: 101 mEq/L (ref 96–112)
Creatinine, Ser: 1.11 mg/dL (ref 0.40–1.50)
GFR: 82.28 mL/min (ref >60.00)
Glucose, Bld: 85 mg/dL (ref 70–99)
Potassium: 4.9 mEq/L (ref 3.5–5.1)
Sodium: 138 mEq/L (ref 135–145)
Total Bilirubin: 1.1 mg/dL (ref 0.2–1.2)
Total Protein: 7 g/dL (ref 6.0–8.3)

## 2022-10-27 LAB — CBC
HCT: 44.7 % (ref 39.0–52.0)
Hemoglobin: 15 g/dL (ref 13.0–17.0)
MCHC: 33.5 g/dL (ref 30.0–36.0)
MCV: 79.6 fl (ref 78.0–100.0)
Platelets: 179 10*3/uL (ref 150.0–400.0)
RBC: 5.62 Mil/uL (ref 4.22–5.81)
RDW: 13.6 % (ref 11.5–15.5)
WBC: 4.7 10*3/uL (ref 4.0–10.5)

## 2022-10-27 LAB — LIPID PANEL
Cholesterol: 200 mg/dL (ref 0–200)
HDL: 58.7 mg/dL (ref >39.00)
LDL Cholesterol: 124 mg/dL — ABNORMAL HIGH (ref 0–99)
NonHDL: 141.77
Total CHOL/HDL Ratio: 3
Triglycerides: 91 mg/dL (ref 0.0–149.0)
VLDL: 18.2 mg/dL (ref 0.0–40.0)

## 2022-10-27 NOTE — Patient Instructions (Signed)
Give us 2-3 business days to get the results of your labs back.   Keep the diet clean and stay active.  Please get me a copy of your advanced directive form at your convenience.   Let us know if you need anything.  

## 2022-10-27 NOTE — Progress Notes (Signed)
Chief Complaint  Patient presents with   Annual Exam    Well Male Matthew Schmidt is here for a complete physical.   His last physical was >1 year ago.  Current diet: in general, a "healthy" diet.   Current exercise: lifting wts, yoga Weight trend: stable Fatigue out of ordinary? No. Seat belt? Yes.   Advanced directive? In process of completing  Health maintenance Tetanus- Yes HIV- Yes Hep C- Yes  Past Medical History:  Diagnosis Date   Hemorrhoids      Past Surgical History:  Procedure Laterality Date   HEMORRHOID SURGERY N/A 11/13/2019   Procedure: HEMORRHOIDECTOMY;  Surgeon: Kinsinger, Arta Bruce, MD;  Location: [place];  Service: General;  Laterality: N/A;   LAPAROSCOPIC APPENDECTOMY N/A 07/21/2015   Procedure: APPENDECTOMY LAPAROSCOPIC;  Surgeon: Judeth Horn, MD;  Location: [place];  Service: General;  Laterality: N/A;    Medications  Takes no meds routinely.     Allergies No Known Allergies  Family History Family History  Problem Relation Age of Onset   Thyroid disease Mother     Review of Systems: Constitutional: no fevers or chills Eye:  no recent significant change in vision Ear/Nose/Mouth/Throat:  Ears:  no hearing loss Nose/Mouth/Throat:  no complaints of nasal congestion, no sore throat Cardiovascular:  no chest pain Respiratory:  no shortness of breath Gastrointestinal:  no abdominal pain, no change in bowel habits GU:  Male: negative for dysuria, frequency, and incontinence Musculoskeletal/Extremities:  no pain of the joints Integumentary (Skin/Breast):  no abnormal skin lesions reported Neurologic:  no headaches Endocrine: No unexpected weight changes Hematologic/Lymphatic:  no night sweats  Exam BP 108/62 (BP Location: Left Arm, Patient Position: Sitting, Cuff Size: Normal)   Pulse 61   Temp 97.9 F (36.6 C) (Oral)   Ht 5' 5"$  (1.651 m)   Wt 136 lb 4 oz (61.8 kg)   SpO2 99%   BMI 22.67 kg/m  General:  well  developed, well nourished, in no apparent distress Skin:  no significant moles, warts, or growths Head:  no masses, lesions, or tenderness Eyes:  pupils equal and round, sclera anicteric without injection Ears:  canals without lesions, TMs shiny without retraction, no obvious effusion, no erythema Nose:  nares patent, mucosa normal Throat/Pharynx:  lips and gingiva without lesion; tongue and uvula midline; non-inflamed pharynx; no exudates or postnasal drainage Neck: neck supple without adenopathy, thyromegaly, or masses Lungs:  clear to auscultation, breath sounds equal bilaterally, no respiratory distress Cardio:  regular rate and rhythm, no bruits, no LE edema Abdomen:  abdomen soft, nontender; bowel sounds normal; no masses or organomegaly Rectal: Deferred Musculoskeletal:  symmetrical muscle groups noted without atrophy or deformity Extremities:  no clubbing, cyanosis, or edema, no deformities, no skin discoloration Neuro:  gait normal; deep tendon reflexes normal and symmetric Psych: well oriented with normal range of affect and appropriate judgment/insight  Assessment and Plan  Well adult exam - Plan: CBC, Comprehensive metabolic panel, Lipid panel   Well 42 y.o. male. Counseled on diet and exercise. Advanced directive form requested today.  Other orders as above. Follow up in 1 yr pending the above workup. The patient voiced understanding and agreement to the plan.  [place], DO 10/27/22 9:53 AM

## 2022-10-30 ENCOUNTER — Encounter: Payer: Self-pay | Admitting: Family Medicine

## 2023-06-20 ENCOUNTER — Ambulatory Visit (INDEPENDENT_AMBULATORY_CARE_PROVIDER_SITE_OTHER): Payer: Managed Care, Other (non HMO) | Admitting: Family Medicine

## 2023-06-20 ENCOUNTER — Encounter: Payer: Self-pay | Admitting: Family Medicine

## 2023-06-20 VITALS — BP 118/80 | HR 69 | Temp 98.3°F | Ht 65.0 in | Wt 136.2 lb

## 2023-06-20 DIAGNOSIS — M7701 Medial epicondylitis, right elbow: Secondary | ICD-10-CM | POA: Diagnosis not present

## 2023-06-20 DIAGNOSIS — M79672 Pain in left foot: Secondary | ICD-10-CM | POA: Diagnosis not present

## 2023-06-20 MED ORDER — MELOXICAM 15 MG PO TABS: 15.0000 mg | ORAL_TABLET | Freq: Every day | ORAL | 0 refills | Status: DC

## 2023-06-20 NOTE — Progress Notes (Signed)
Musculoskeletal Exam  Patient: Matthew Schmidt DOB: [birth year]  DOS: 06/20/2023  SUBJECTIVE:  Chief Complaint:   Chief Complaint  Patient presents with   Foot Pain    Left foot     Matthew Schmidt is a 42 y.o.  male for evaluation and treatment of L foot pain.   Onset:  3 days ago. Ran a 5k w no training. Had no pain during the race.  Location: outside of L foot Character:  aching  Progression of issue:  is unchanged Associated symptoms: comes when he walks/runs No bruising, redness, swelling Treatment: to date has been ice and OTC NSAIDS.   Neurovascular symptoms: no  Over the past 8 months, the patient has had pain on the inside of his right elbow.  No specific injury or change in activity.  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  He has been using a forearm strap in addition to a right wrist brace that limits his wrist movement.  No bruising, redness, swelling, or neurologic signs or symptoms.  Past Medical History:  Diagnosis Date   Hemorrhoids     Objective: VITAL SIGNS: BP 118/80 (BP Location: Left Arm, Patient Position: Sitting, Cuff Size: Normal)   Pulse 69   Temp 98.3 F (36.8 C) (Oral)   Ht 5\' 5"  (1.651 m)   Wt 136 lb 4 oz (61.8 kg)   SpO2 99%   BMI 22.67 kg/m  Constitutional: Well formed, well developed. No acute distress. Thorax & Lungs: No accessory muscle use Musculoskeletal: R foot.   Tenderness to palpation: yes over plantar surface of base of 5th MT Deformity: no Ecchymosis: no Right elbow: Normal active and passive range of motion.  No deformity, erythema, ecchymosis.  TTP over the medial epicondyle with pain reproduced with resisted right wrist flexion. Neurologic: Normal sensory function. Psychiatric: Normal mood. Age appropriate judgment and insight. Alert & oriented x 3.    Assessment:  Left foot pain  Golfer's elbow, right  Plan: Suspect contusion of the base of the fifth metatarsal.  Wear good supportive shoes,  consider midfoot/arch supports providing better balance.  He is supinating when he walks putting extra pressure over the area.  Meloxicam daily as needed, ice, Tylenol.  Refer to podiatry/sports medicine if no improvement. Continue forearm strap and wrist brace as needed.  Ice, Tylenol, stretches and exercises.  Consider injection versus physical therapy if no improvement. F/u as originally scheduled. The patient voiced understanding and agreement to the plan.   Jilda Roche [place], DO 06/20/23  4:35 PM

## 2023-06-20 NOTE — Patient Instructions (Addendum)
Consider Powerstep insoles. There are very quality over the counter inserts. Shop around online and in stores. Dr. Margart Sickles is a cheaper alternative, though is not as high of quality.   Ice/cold pack over area for 10-15 min twice daily.  OK to take Tylenol 1000 mg (2 extra strength tabs) or 975 mg (3 regular strength tabs) every 6 hours as needed.  Send me a message if you are not improving with either in the next 3 weeks.   Let us know if you need anything.  Golfer's Elbow Rehab It is normal to feel mild stretching, pulling, tightness, or discomfort as you do these exercises, but you should stop right away if you feel sudden pain or your pain gets worse.   Stretching and range of motion exercises These exercises warm up your muscles and joints and improve the movement and flexibility of your elbow. Exercise A: Wrist flexors  Straighten your left / right elbow in front of you with your palm up. If told by your health care provider, do this stretch with your elbow bent rather than straight. With your other hand, gently pull your left / right hand and fingers toward you until you feel a gentle stretch on the top of your forearm. Hold this position for 30 seconds. Repeat 2 times. Complete this exercise 3 times per week. Strengthening exercises These exercises build strength and endurance in your elbow. Endurance is the ability to use your muscles for a long time, even after several repetitions.  Exercise B: Wrist flexion  Sit with your left / right forearm palm-up and supported on a table or other surface. Let your left / right wrist extend over the edge of the surface. Hold a 3-5 lb weight or a piece of rubber exercise band or tubing. Slowly curl your hand up toward your forearm. Try to only move your hand and keep the rest of your arm still. Hold this position for 3-5 seconds. Slowly return to the starting position. Repeat 2 times. Complete this exercise 3 times per week. Exercise C:  Eccentric wrist flexion Sit with your left / right forearm palm-up and supported on a table or other surface. Let your left / right wrist extend over the edge of the surface. Hold a 3-5 lb weight or a piece of rubber exercise band or tubing. Use your other hand to move your left / right hand up toward your forearm. Slowly return to the starting position using only your left / right hand. Repeat 2 times. Complete this exercise 3 times per week. Exercise D: Hand turns, pronation i Sit with your left / right forearm supported on a table or other surface. Move your wrist so your pinkie travels toward your forearm and your thumb moves away from your forearm. Hold this position for 3 seconds. Slowly return to the starting position. Exercise E: Hand turns, pronation ii  Sit with your left / right forearm supported on a table or other surface. Hold a hammer in your left / right hand. The exercise will be easier if you hold on near the head of the hammer. If you hold on toward the end of the handle, the exercise will be harder. Rest your left / right hand over the edge of the table with your palm facing up. Without moving your elbow, slowly turn your palm and your hand down toward the table. Hold this position for 3 seconds. Slowly return to the starting position. Repeat 2 times. Complete this exercise 3 times per  week. Exercise F: Shoulder blade squeeze Sit in a stable chair with good posture. Do not let your back touch the back of the chair. Your arms should be at your sides with your elbows bent. You can rest your forearms on a pillow. Squeeze your shoulder blades together. Keep your shoulders level. Do not lift your shoulders up toward your ears. Hold this position for 3 seconds. Slowly release. Repeat 2 times. Complete this exercise 3 times per week. Make sure you discuss any questions you have with your health care provider. Document Released: 08/28/2005 Document Revised: 05/04/2016  Document Reviewed: 05/10/2015 Elsevier Interactive Patient Education  [employer].

## 2024-02-26 ENCOUNTER — Ambulatory Visit: Admitting: Family Medicine

## 2024-06-26 ENCOUNTER — Encounter: Payer: Self-pay | Admitting: Family Medicine

## 2024-06-26 ENCOUNTER — Ambulatory Visit (INDEPENDENT_AMBULATORY_CARE_PROVIDER_SITE_OTHER): Admitting: Family Medicine

## 2024-06-26 ENCOUNTER — Ambulatory Visit: Payer: Self-pay | Admitting: Family Medicine

## 2024-06-26 VITALS — BP 110/64 | HR 83 | Temp 98.0°F | Resp 16 | Ht 65.0 in | Wt 139.6 lb

## 2024-06-26 DIAGNOSIS — Z0000 Encounter for general adult medical examination without abnormal findings: Secondary | ICD-10-CM

## 2024-06-26 LAB — COMPREHENSIVE METABOLIC PANEL WITH GFR
ALT: 21 U/L (ref 0–53)
AST: 20 U/L (ref 0–37)
Albumin: 4.6 g/dL (ref 3.5–5.2)
Alkaline Phosphatase: 74 U/L (ref 39–117)
BUN: 17 mg/dL (ref 6–23)
CO2: 30 meq/L (ref 19–32)
Calcium: 9.7 mg/dL (ref 8.4–10.5)
Chloride: 103 meq/L (ref 96–112)
Creatinine, Ser: 1.03 mg/dL (ref 0.40–1.50)
GFR: 88.96 mL/min (ref >60.00)
Glucose, Bld: 95 mg/dL (ref 70–99)
Potassium: 4.4 meq/L (ref 3.5–5.1)
Sodium: 141 meq/L (ref 135–145)
Total Bilirubin: 0.8 mg/dL (ref 0.2–1.2)
Total Protein: 6.9 g/dL (ref 6.0–8.3)

## 2024-06-26 LAB — CBC
HCT: 44.1 % (ref 39.0–52.0)
Hemoglobin: 14.2 g/dL (ref 13.0–17.0)
MCHC: 32.2 g/dL (ref 30.0–36.0)
MCV: 79 fl (ref 78.0–100.0)
Platelets: 206 K/uL (ref 150.0–400.0)
RBC: 5.58 Mil/uL (ref 4.22–5.81)
RDW: 12.7 % (ref 11.5–15.5)
WBC: 4.7 K/uL (ref 4.0–10.5)

## 2024-06-26 LAB — LIPID PANEL
Cholesterol: 201 mg/dL — ABNORMAL HIGH (ref 0–200)
HDL: 56.8 mg/dL (ref >39.00)
LDL Cholesterol: 126 mg/dL — ABNORMAL HIGH (ref 0–99)
NonHDL: 143.94
Total CHOL/HDL Ratio: 4
Triglycerides: 88 mg/dL (ref 0.0–149.0)
VLDL: 17.6 mg/dL (ref 0.0–40.0)

## 2024-06-26 NOTE — Progress Notes (Signed)
 Chief Complaint  Patient presents with   Annual Exam    CPE    Well Male Matthew Schmidt is here for a complete physical.   His last physical was >1 year ago.  Current diet: in general, a healthy diet.   Current exercise: tennis, strength training, walking Weight trend: stable Fatigue out of ordinary? No. Seat belt? Yes.   Advanced directive? Yes  Health maintenance Tetanus- Yes HIV- Yes Hep C- Yes  Past Medical History:  Diagnosis Date   Hemorrhoids      Past Surgical History:  Procedure Laterality Date   HEMORRHOID SURGERY N/A 11/13/2019   Procedure: HEMORRHOIDECTOMY;  Surgeon: Kinsinger, Herlene Righter, MD;  Location: [facility];  Service: General;  Laterality: N/A;   LAPAROSCOPIC APPENDECTOMY N/A 07/21/2015   Procedure: APPENDECTOMY LAPAROSCOPIC;  Surgeon: Lynwood Pina, MD;  Location: [facility];  Service: General;  Laterality: N/A;    Medications  Takes no meds routinely.    Allergies No Known Allergies  Family History Family History  Problem Relation Age of Onset   Thyroid disease Mother     Review of Systems: Constitutional: no fevers or chills Eye:  no recent significant change in vision Ear/Nose/Mouth/Throat:  Ears:  no hearing loss Nose/Mouth/Throat:  no complaints of nasal congestion, no sore throat Cardiovascular:  no chest pain Respiratory:  no shortness of breath Gastrointestinal:  no abdominal pain, no change in bowel habits GU:  Male: negative for dysuria, frequency, and incontinence Musculoskeletal/Extremities: +L neck pain; otherwise no pain of the joints Integumentary (Skin/Breast):  no abnormal skin lesions reported Neurologic:  no headaches Endocrine: No unexpected weight changes Hematologic/Lymphatic:  no night sweats  Exam BP 110/64 (BP Location: Left Arm, Patient Position: Sitting)   Pulse 83   Temp 98 F (36.7 C) (Oral)   Resp 16   Ht 5' 5 (1.651 m)   Wt 139 lb 9.6 oz (63.3 kg)   SpO2 98%   BMI 23.23 kg/m   General:  well developed, well nourished, in no apparent distress Skin:  no significant moles, warts, or growths Head:  no masses, lesions, or tenderness Eyes:  pupils equal and round, sclera anicteric without injection Ears:  canals without lesions, TMs shiny without retraction, no obvious effusion, no erythema Nose:  nares patent, mucosa normal Throat/Pharynx:  lips and gingiva without lesion; tongue and uvula midline; non-inflamed pharynx; no exudates or postnasal drainage Neck: neck supple  without adenopathy, thyromegaly, or masses Lungs:  clear to auscultation, breath sounds equal bilaterally, no respiratory distress Cardio:  regular rate and rhythm, no bruits, no LE edema Abdomen:  abdomen soft, nontender; bowel sounds normal; no masses or organomegaly Rectal: Deferred Musculoskeletal: mild ttp over lat neck msc;  symmetrical muscle groups noted without atrophy or deformity Extremities:  no clubbing, cyanosis, or edema, no deformities, no skin discoloration Neuro:  gait normal; deep tendon reflexes normal and symmetric Psych: well oriented with normal range of affect and appropriate judgment/insight  Assessment and Plan  Well adult exam - Plan: CBC, Comprehensive metabolic panel with GFR, Hepatitis B surface antibody,quantitative, Lipid panel   Well 43 y.o. male. Counseled on diet and exercise. Advanced directive form provided today.  Flu shot politely declined. Screen hep B. Other orders as above. Follow up in 1 year pending the above workup. The patient voiced understanding and agreement to the plan.  Mabel [place], DO 06/26/24 8:44 AM

## 2024-06-26 NOTE — Patient Instructions (Addendum)
 Give us  2-3 business days to get the results of your labs back.   Keep the diet clean and stay active.  Please get me a copy of your advanced directive form at your convenience.   Hold your stretches for 30 seconds or longer.  Try not to scratch as this can make things worse. Avoid scented products while dealing with this. You may resume when the itchiness resolves. Cold/cool compresses can help.   Let us  know if you need anything.  EXERCISES RANGE OF MOTION (ROM) AND STRETCHING EXERCISES  These exercises may help you when beginning to rehabilitate your issue. In order to successfully resolve your symptoms, you must improve your posture. These exercises are designed to help reduce the forward-head and rounded-shoulder posture which contributes to this condition. Your symptoms may resolve with or without further involvement from your physician, physical therapist or athletic trainer. While completing these exercises, remember:  Restoring tissue flexibility helps normal motion to return to the joints. This allows healthier, less painful movement and activity. An effective stretch should be held for at least 20 seconds, although you may need to begin with shorter hold times for comfort. A stretch should never be painful. You should only feel a gentle lengthening or release in the stretched tissue. Do not do any stretch or exercise that you cannot tolerate.  STRETCH- Axial Extensors Lie on your back on the floor. You may bend your knees for comfort. Place a rolled-up hand towel or dish towel, about 2 inches in diameter, under the part of your head that makes contact with the floor. Gently tuck your chin, as if trying to make a double chin, until you feel a gentle stretch at the base of your head. Hold 15-20 seconds. Repeat 2-3 times. Complete this exercise 1 time per day.   STRETCH - Axial Extension  Stand or sit on a firm surface. Assume a good posture: chest up, shoulders drawn back,  abdominal muscles slightly tense, knees unlocked (if standing) and feet hip width apart. Slowly retract your chin so your head slides back and your chin slightly lowers. Continue to look straight ahead. You should feel a gentle stretch in the back of your head. Be certain not to feel an aggressive stretch since this can cause headaches later. Hold for 15-20 seconds. Repeat 2-3 times. Complete this exercise 1 time per day.  STRETCH - Cervical Side Bend  Stand or sit on a firm surface. Assume a good posture: chest up, shoulders drawn back, abdominal muscles slightly tense, knees unlocked (if standing) and feet hip width apart. Without letting your nose or shoulders move, slowly tip your right / left ear to your shoulder until your feel a gentle stretch in the muscles on the opposite side of your neck. Hold 15-20 seconds. Repeat 2-3 times. Complete this exercise 1-2 times per day.  STRETCH - Cervical Rotators  Stand or sit on a firm surface. Assume a good posture: chest up, shoulders drawn back, abdominal muscles slightly tense, knees unlocked (if standing) and feet hip width apart. Keeping your eyes level with the ground, slowly turn your head until you feel a gentle stretch along the back and opposite side of your neck. Hold 15-20 seconds. Repeat 2-3 times. Complete this exercise 1-2 times per day.  RANGE OF MOTION - Neck Circles  Stand or sit on a firm surface. Assume a good posture: chest up, shoulders drawn back, abdominal muscles slightly tense, knees unlocked (if standing) and feet hip width apart. Gently roll  your head down and around from the back of one shoulder to the back of the other. The motion should never be forced or painful. Repeat the motion 10-20 times, or until you feel the neck muscles relax and loosen. Repeat 2-3 times. Complete the exercise 1-2 times per day. STRENGTHENING EXERCISES - Cervical Strain and Sprain These exercises may help you when beginning to rehabilitate  your injury. They may resolve your symptoms with or without further involvement from your physician, physical therapist, or athletic trainer. While completing these exercises, remember:  Muscles can gain both the endurance and the strength needed for everyday activities through controlled exercises. Complete these exercises as instructed by your physician, physical therapist, or athletic trainer. Progress the resistance and repetitions only as guided. You may experience muscle soreness or fatigue, but the pain or discomfort you are trying to eliminate should never worsen during these exercises. If this pain does worsen, stop and make certain you are following the directions exactly. If the pain is still present after adjustments, discontinue the exercise until you can discuss the trouble with your clinician.  STRENGTH - Cervical Flexors, Isometric Face a wall, standing about 6 inches away. Place a small pillow, a ball about 6-8 inches in diameter, or a folded towel between your forehead and the wall. Slightly tuck your chin and gently push your forehead into the soft object. Push only with mild to moderate intensity, building up tension gradually. Keep your jaw and forehead relaxed. Hold 10 to 20 seconds. Keep your breathing relaxed. Release the tension slowly. Relax your neck muscles completely before you start the next repetition. Repeat 2-3 times. Complete this exercise 1 time per day.  STRENGTH- Cervical Lateral Flexors, Isometric  Stand about 6 inches away from a wall. Place a small pillow, a ball about 6-8 inches in diameter, or a folded towel between the side of your head and the wall. Slightly tuck your chin and gently tilt your head into the soft object. Push only with mild to moderate intensity, building up tension gradually. Keep your jaw and forehead relaxed. Hold 10 to 20 seconds. Keep your breathing relaxed. Release the tension slowly. Relax your neck muscles completely before you start  the next repetition. Repeat 2-3 times. Complete this exercise 1 time per day.  STRENGTH - Cervical Extensors, Isometric  Stand about 6 inches away from a wall. Place a small pillow, a ball about 6-8 inches in diameter, or a folded towel between the back of your head and the wall. Slightly tuck your chin and gently tilt your head back into the soft object. Push only with mild to moderate intensity, building up tension gradually. Keep your jaw and forehead relaxed. Hold 10 to 20 seconds. Keep your breathing relaxed. Release the tension slowly. Relax your neck muscles completely before you start the next repetition. Repeat 2-3 times. Complete this exercise 1 time per day.  POSTURE AND BODY MECHANICS CONSIDERATIONS Keeping correct posture when sitting, standing or completing your activities will reduce the stress put on different body tissues, allowing injured tissues a chance to heal and limiting painful experiences. The following are general guidelines for improved posture. Your physician or physical therapist will provide you with any instructions specific to your needs. While reading these guidelines, remember: The exercises prescribed by your provider will help you have the flexibility and strength to maintain correct postures. The correct posture provides the optimal environment for your joints to work. All of your joints have less wear and tear when properly  supported by a spine with good posture. This means you will experience a healthier, less painful body. Correct posture must be practiced with all of your activities, especially prolonged sitting and standing. Correct posture is as important when doing repetitive low-stress activities (typing) as it is when doing a single heavy-load activity (lifting).  PROLONGED STANDING WHILE SLIGHTLY LEANING FORWARD When completing a task that requires you to lean forward while standing in one place for a long time, place either foot up on a stationary 2-  to 4-inch high object to help maintain the best posture. When both feet are on the ground, the low back tends to lose its slight inward curve. If this curve flattens (or becomes too large), then the back and your other joints will experience too much stress, fatigue more quickly, and can cause pain.   RESTING POSITIONS Consider which positions are most painful for you when choosing a resting position. If you have pain with flexion-based activities (sitting, bending, stooping, squatting), choose a position that allows you to rest in a less flexed posture. You would want to avoid curling into a fetal position on your side. If your pain worsens with extension-based activities (prolonged standing, working overhead), avoid resting in an extended position such as sleeping on your stomach. Most people will find more comfort when they rest with their spine in a more neutral position, neither too rounded nor too arched. Lying on a non-sagging bed on your side with a pillow between your knees, or on your back with a pillow under your knees will often provide some relief. Keep in mind, being in any one position for a prolonged period of time, no matter how correct your posture, can still lead to stiffness.  WALKING Walk with an upright posture. Your ears, shoulders, and hips should all line up. OFFICE WORK When working at a desk, create an environment that supports good, upright posture. Without extra support, muscles fatigue and lead to excessive strain on joints and other tissues.  CHAIR: A chair should be able to slide under your desk when your back makes contact with the back of the chair. This allows you to work closely. The chair's height should allow your eyes to be level with the upper part of your monitor and your hands to be slightly lower than your elbows. Body position: Your feet should make contact with the floor. If this is not possible, use a foot rest. Keep your ears over your shoulders. This  will reduce stress on your neck and low back.

## 2024-06-27 LAB — HEPATITIS B SURFACE ANTIBODY, QUANTITATIVE: Hep B S AB Quant (Post): <5 m[IU]/mL — ABNORMAL LOW (ref >=10)

## 2025-06-29 ENCOUNTER — Encounter: Admitting: Family Medicine
